# Patient Record
Sex: Female | Born: 1980 | Race: White | Hispanic: Yes | Marital: Married | Smoking: Never smoker
Health system: Southern US, Community
[De-identification: ages and names within clinical notes are randomized; demographics above are authoritative.]

## PROBLEM LIST (undated history)

## (undated) HISTORY — PX: BREAST SURGERY: SHX581

---

## 2000-10-28 ENCOUNTER — Other Ambulatory Visit: Admission: RE | Admit: 2000-10-28 | Discharge: 2000-10-28 | Payer: Self-pay | Admitting: *Deleted

## 2000-10-28 ENCOUNTER — Other Ambulatory Visit: Admission: RE | Admit: 2000-10-28 | Discharge: 2000-10-28 | Payer: Self-pay | Admitting: Urology

## 2001-03-14 ENCOUNTER — Inpatient Hospital Stay (HOSPITAL_COMMUNITY): Admission: AD | Admit: 2001-03-14 | Discharge: 2001-03-17 | Payer: Self-pay | Admitting: Obstetrics & Gynecology

## 2005-04-11 ENCOUNTER — Inpatient Hospital Stay (HOSPITAL_COMMUNITY): Admission: AD | Admit: 2005-04-11 | Discharge: 2005-04-12 | Payer: Self-pay | Admitting: *Deleted

## 2005-11-13 ENCOUNTER — Ambulatory Visit: Payer: Self-pay | Admitting: Gynecology

## 2005-11-13 ENCOUNTER — Inpatient Hospital Stay (HOSPITAL_COMMUNITY): Admission: AD | Admit: 2005-11-13 | Discharge: 2005-11-16 | Payer: Self-pay | Admitting: Gynecology

## 2005-11-19 ENCOUNTER — Ambulatory Visit: Payer: Self-pay | Admitting: Family Medicine

## 2007-04-24 ENCOUNTER — Emergency Department (HOSPITAL_COMMUNITY): Admission: EM | Admit: 2007-04-24 | Discharge: 2007-04-24 | Payer: Self-pay | Admitting: Family Medicine

## 2010-06-04 ENCOUNTER — Emergency Department (HOSPITAL_COMMUNITY)
Admission: EM | Admit: 2010-06-04 | Discharge: 2010-06-04 | Disposition: A | Discharge status: Home / Self Care | Payer: Self-pay | Attending: Emergency Medicine | Admitting: Emergency Medicine

## 2010-06-04 ENCOUNTER — Emergency Department (HOSPITAL_COMMUNITY): Payer: Self-pay

## 2010-06-04 DIAGNOSIS — R509 Fever, unspecified: Secondary | ICD-10-CM | POA: Insufficient documentation

## 2010-06-04 DIAGNOSIS — R0789 Other chest pain: Secondary | ICD-10-CM | POA: Insufficient documentation

## 2010-06-04 DIAGNOSIS — R0602 Shortness of breath: Secondary | ICD-10-CM | POA: Insufficient documentation

## 2010-06-04 DIAGNOSIS — M546 Pain in thoracic spine: Secondary | ICD-10-CM | POA: Insufficient documentation

## 2010-06-04 LAB — CBC
HCT: 38.8 % (ref 36.0–46.0)
MCHC: 33.5 g/dL (ref 30.0–36.0)
MCV: 84.5 fL (ref 78.0–100.0)
RBC: 4.59 MIL/uL (ref 3.87–5.11)
RDW: 13.2 % (ref 11.5–15.5)
WBC: 8.9 10*3/uL (ref 4.0–10.5)

## 2010-06-04 LAB — DIFFERENTIAL
Basophils Absolute: 0.1 10*3/uL (ref 0.0–0.1)
Lymphs Abs: 0.5 10*3/uL — ABNORMAL LOW (ref 0.7–4.0)
Monocytes Absolute: 1.3 10*3/uL — ABNORMAL HIGH (ref 0.1–1.0)
Neutro Abs: 6.4 10*3/uL (ref 1.7–7.7)

## 2010-06-04 LAB — URINALYSIS, ROUTINE W REFLEX MICROSCOPIC
Bilirubin Urine: NEGATIVE
Hgb urine dipstick: NEGATIVE
Protein, ur: NEGATIVE mg/dL
Specific Gravity, Urine: 1.005 (ref 1.005–1.030)

## 2010-06-04 LAB — BASIC METABOLIC PANEL
CO2: 22 mEq/L (ref 19–32)
Chloride: 108 mEq/L (ref 96–112)
Creatinine, Ser: 0.46 mg/dL (ref 0.4–1.2)
GFR calc non Af Amer: >60 mL/min (ref >60)
Glucose, Bld: 129 mg/dL — ABNORMAL HIGH (ref 70–99)

## 2010-06-04 LAB — PREGNANCY, URINE: Preg Test, Ur: NEGATIVE

## 2010-06-04 LAB — URINE MICROSCOPIC-ADD ON

## 2010-11-17 LAB — POCT URINALYSIS DIP (DEVICE)
Bilirubin Urine: NEGATIVE
Ketones, ur: NEGATIVE
Protein, ur: 30 — AB
Urobilinogen, UA: 1

## 2010-11-17 LAB — POCT PREGNANCY, URINE: Preg Test, Ur: NEGATIVE

## 2016-02-24 HISTORY — PX: BREAST CYST ASPIRATION: SHX578

## 2016-07-07 ENCOUNTER — Ambulatory Visit (HOSPITAL_COMMUNITY)
Admission: EM | Admit: 2016-07-07 | Discharge: 2016-07-07 | Disposition: A | Discharge status: Home / Self Care | Payer: Self-pay | Attending: Internal Medicine | Admitting: Internal Medicine

## 2016-07-07 ENCOUNTER — Encounter (HOSPITAL_COMMUNITY): Payer: Self-pay | Admitting: Emergency Medicine

## 2016-07-07 DIAGNOSIS — J029 Acute pharyngitis, unspecified: Secondary | ICD-10-CM

## 2016-07-07 DIAGNOSIS — R0982 Postnasal drip: Secondary | ICD-10-CM

## 2016-07-07 DIAGNOSIS — J301 Allergic rhinitis due to pollen: Secondary | ICD-10-CM

## 2016-07-07 NOTE — Discharge Instructions (Signed)
If more likely that your sore throat is caused by the drainage in the back of the throat. Since the Claritin and Joyce Copallegra is not working you may continue taking those and add Chlor-Trimeton 2 or 4 mg every 4 hours. This is a stronger antihistamine to decrease or drainage and it can cause drowsiness. Also drink more fluids, particularly water. Drink a glass of water before bedtime and upon getting up in the morning. This will take several days to resolve.

## 2016-07-07 NOTE — ED Provider Notes (Signed)
CSN: [id number]     Arrival date & time 07/07/16  1447 History   None    Chief Complaint  Patient presents with  . Sore Throat   (Consider location/radiation/quality/duration/timing/severity/associated sxs/prior Treatment) 36 year old spent female accompanied by significant other presents 1 day after she visited her PCP for laryngitis for PND and other allergy type symptoms. She was given the medicines that were listed in the nursing notes including Claritin, amoxicillin and a couple of other medications. She states that within one day she has not improved. She still feels as though something is in her throat.      History reviewed. No pertinent past medical history. History reviewed. No pertinent surgical history. History reviewed. No pertinent family history. Social History  Substance Use Topics  . Smoking status: Never Smoker  . Smokeless tobacco: Never Used  . Alcohol use No   OB History    No data available     Review of Systems  Constitutional: Negative.  Negative for activity change, chills and fever.  HENT: Positive for congestion, postnasal drip, rhinorrhea and sore throat.   Eyes: Negative.   Respiratory: Negative for cough, chest tightness and shortness of breath.   Cardiovascular: Negative for chest pain.  Gastrointestinal: Negative.   Genitourinary: Negative.   Skin: Negative.   All other systems reviewed and are negative.   Allergies  Patient has no known allergies.  Home Medications   Prior to Admission medications   Medication Sig Start Date End Date Taking? Authorizing Provider  amoxicillin (AMOXIL) 500 MG capsule Take 500 mg by mouth 3 (three) times daily.   Yes [provider]  benzonatate (TESSALON) 100 MG capsule Take by mouth 3 (three) times daily as needed for cough.   Yes [provider]  loratadine (CLARITIN) 10 MG tablet Take 10 mg by mouth daily.   Yes [provider]  naproxen (NAPROSYN) 500 MG tablet Take 500 mg  by mouth 2 (two) times daily with a meal.   Yes [provider]   Meds Ordered and Administered this Visit  Medications - No data to display  BP 130/78 (BP Location: Right Arm)   Pulse 67   Temp 98.3 F (36.8 C) (Oral)   Resp 18   SpO2 100%  No data found.   Physical Exam  Constitutional: She is oriented to person, place, and time. She appears well-developed and well-nourished. No distress.  HENT:  Mouth/Throat: No oropharyngeal exudate.  Oropharynx with moderate amount of clear PND and cobblestoning.  Neck: Neck supple.  Cardiovascular: Normal rate and regular rhythm.   Pulmonary/Chest: Effort normal and breath sounds normal.  Lymphadenopathy:    She has no cervical adenopathy.  Neurological: She is alert and oriented to person, place, and time.  Skin: Skin is warm and dry.  Nursing note and vitals reviewed.   Urgent Care Course     Procedures (including critical care time)  Labs Review Labs Reviewed - No data to display  Imaging Review No results found.   Visual Acuity Review  Right Eye Distance:   Left Eye Distance:   Bilateral Distance:    Right Eye Near:   Left Eye Near:    Bilateral Near:         MDM   1. Sore throat   2. PND (post-nasal drip)   3. Seasonal allergic rhinitis due to pollen    If more likely that your sore throat is caused by the drainage in the back of the throat. Since  the Claritin and Joyce Copa is not working you may continue taking those and add Chlor-Trimeton 2 or 4 mg every 4 hours. This is a stronger antihistamine to decrease or drainage and it can cause drowsiness. Also drink more fluids, particularly water. Drink a glass of water before bedtime and upon getting up in the morning. This will take several days to resolve.     Hayden Rasmussen, NP 07/07/16 1728

## 2016-07-07 NOTE — ED Triage Notes (Addendum)
[facility] a complaint of a sore throat and laryngitis x 1 week. The patient was evaluated yesterday and prescribed Naproxen, Tessalon, Claritin, and Amoxacillin and she stated that the medication has not worked.

## 2016-08-05 ENCOUNTER — Other Ambulatory Visit: Payer: Self-pay | Admitting: Emergency Medicine

## 2016-08-05 ENCOUNTER — Ambulatory Visit (INDEPENDENT_AMBULATORY_CARE_PROVIDER_SITE_OTHER): Payer: Self-pay | Admitting: Emergency Medicine

## 2016-08-05 ENCOUNTER — Encounter: Payer: Self-pay | Admitting: Emergency Medicine

## 2016-08-05 VITALS — BP 120/77 | HR 85 | Temp 98.7°F | Resp 16 | Ht 62.0 in | Wt 144.8 lb

## 2016-08-05 DIAGNOSIS — N6320 Unspecified lump in the left breast, unspecified quadrant: Secondary | ICD-10-CM

## 2016-08-05 DIAGNOSIS — N63 Unspecified lump in unspecified breast: Secondary | ICD-10-CM | POA: Insufficient documentation

## 2016-08-05 NOTE — Patient Instructions (Addendum)
  Ultrasound of left breast@ [phone] and they encourage you to Apply for Scholarship program so you don't have to come out of pocket for your mammogram.  Your appointment is Thursday 08/06/16 @ 1:00 @ [facility] [address], [address].   IF you received an x-ray today, you will receive an invoice from [facility]. Please contact [facility] at [phone] with questions or concerns regarding your invoice.   IF you received labwork today, you will receive an invoice from [facility]. Please contact [facility] at [phone] with questions or concerns regarding your invoice.   Our billing staff will not be able to assist you with questions regarding bills from these companies.  You will be contacted with the lab results as soon as they are available. The fastest way to get your results is to activate your My Chart account. Instructions are located on the last page of this paperwork. If you have not heard from us regarding the results in 2 weeks, please contact this office.

## 2016-08-05 NOTE — Progress Notes (Signed)
Joan Murphy 35 y.o.   Chief Complaint  Patient presents with  . Mass    painful lump on left breast, noticed 3 days ago     HISTORY OF PRESENT ILLNESS: This is a 36 y.o. female complaining of painful lump to left breast x 3 days.  HPI   Prior to Admission medications   Medication Sig Start Date End Date Taking? Authorizing Provider  amoxicillin (AMOXIL) 500 MG capsule Take 500 mg by mouth 3 (three) times daily.    [provider]  benzonatate (TESSALON) 100 MG capsule Take by mouth 3 (three) times daily as needed for cough.    [provider]  loratadine (CLARITIN) 10 MG tablet Take 10 mg by mouth daily.    [provider]  naproxen (NAPROSYN) 500 MG tablet Take 500 mg by mouth 2 (two) times daily with a meal.    [provider]    No Known Allergies  There are no active problems to display for this patient.   No past medical history on file.  No past surgical history on file.  Social History   Social History  . Marital status: Married    Spouse name: N/A  . Number of children: N/A  . Years of education: N/A   Occupational History  . Not on file.   Social History Main Topics  . Smoking status: Never Smoker  . Smokeless tobacco: Never Used  . Alcohol use No  . Drug use: No  . Sexual activity: Not on file   Other Topics Concern  . Not on file   Social History Narrative  . No narrative on file    No family history on file.   Review of Systems  Constitutional: Negative.  Negative for chills, fever and weight loss.  HENT: Negative.   Eyes: Negative.   Respiratory: Negative.  Negative for cough and shortness of breath.   Cardiovascular: Negative for chest pain and palpitations.  Gastrointestinal: Negative for abdominal pain, nausea and vomiting.  Musculoskeletal: Negative for myalgias and neck pain.  Skin: Negative for rash.  Neurological: Negative for dizziness and headaches.  Endo/Heme/Allergies: Negative.     All other systems reviewed and are negative.  Vitals:   08/05/16 0821  BP: 120/77  Pulse: 85  Resp: 16  Temp: 98.7 F (37.1 C)      Physical Exam  Constitutional: She is oriented to person, place, and time. She appears well-developed and well-nourished.  HENT:  Head: Normocephalic and atraumatic.  Eyes: Conjunctivae and EOM are normal. Pupils are equal, round, and reactive to light.  Neck: Normal range of motion. Neck supple. No JVD present. No thyromegaly present.  Cardiovascular: Normal rate, regular rhythm and normal heart sounds.   Pulmonary/Chest: Effort normal and breath sounds normal.    No nipple discharge or axillary adenopathy.  Abdominal: Soft.  Lymphadenopathy:    She has no cervical adenopathy.  Neurological: She is alert and oriented to person, place, and time.  Skin: Skin is warm and dry. Capillary refill takes less than 2 seconds.  Psychiatric: She has a normal mood and affect. Her behavior is normal.  Vitals reviewed.    ASSESSMENT & PLAN: Joan Murphy was seen today for mass.  Diagnoses and all orders for this visit:  Breast lump in female -     [place] Unlisted Procedure Breast; Future   Patient Instructions    Ultrasound of left breast@ [facility]-[phone] and they encourage you to Apply for Scholarship program so you  don't have to come out of pocket for your mammogram.  Your appointment is Thursday 08/06/16 @ 1:00 @ [facility] [address], [address].   IF you received an x-ray today, you will receive an invoice from [facility]. Please contact [facility] at [phone] with questions or concerns regarding your invoice.   IF you received labwork today, you will receive an invoice from [facility]. Please contact [facility] at [phone] with questions or concerns regarding your invoice.   Our billing staff will not be able to assist you with questions regarding bills from these companies.  You will be  contacted with the lab results as soon as they are available. The fastest way to get your results is to activate your My Chart account. Instructions are located on the last page of this paperwork. If you have not heard from us regarding the results in 2 weeks, please contact this office.          Edwina BarthMiguel Maricela Kawahara, MD [facility]

## 2016-08-06 ENCOUNTER — Other Ambulatory Visit: Payer: Self-pay | Admitting: Emergency Medicine

## 2016-08-06 ENCOUNTER — Telehealth: Payer: Self-pay | Admitting: Emergency Medicine

## 2016-08-06 ENCOUNTER — Ambulatory Visit
Admission: RE | Admit: 2016-08-06 | Discharge: 2016-08-06 | Disposition: A | Discharge status: Home / Self Care | Payer: No Typology Code available for payment source | Source: Ambulatory Visit | Attending: Emergency Medicine | Admitting: Emergency Medicine

## 2016-08-06 DIAGNOSIS — N6320 Unspecified lump in the left breast, unspecified quadrant: Secondary | ICD-10-CM

## 2016-08-06 NOTE — Addendum Note (Signed)
Addended by: Evie LacksSAGARDIA, Meleane Selinger J on: 08/06/2016 03:43 PM   Modules accepted: Orders

## 2016-08-06 NOTE — Telephone Encounter (Signed)
Spoke to patient about results and course of action. Scheduled for biopsy next Thursday.

## 2016-08-10 ENCOUNTER — Telehealth: Payer: Self-pay | Admitting: Emergency Medicine

## 2016-08-10 NOTE — Telephone Encounter (Signed)
Does she needs to be seen sooner than 7/3? Please advise

## 2016-08-10 NOTE — Telephone Encounter (Signed)
It can wait 

## 2016-08-10 NOTE — Telephone Encounter (Signed)
[facility] called and said they were having trouble contacting pt but also could not schedule pt until 08/25/16. They said they would check with their Doctors and see if anything else could be done, and I also told them I would try another office to see if we can get pt seen sooner.

## 2016-08-10 NOTE — Telephone Encounter (Signed)
Referrals sent for both oncology and general surgery on 6/15 to [facility] and [facility]. [facility] contacted me and said pt needs to follow up with general [facility] at [facility] and canceled the oncology referral.

## 2016-08-10 NOTE — Telephone Encounter (Signed)
Ok to wait

## 2016-08-11 NOTE — Telephone Encounter (Signed)
Pt is scheduled for 7/5 at 9am at [facility]. I spoke with pt to let her know this.

## 2016-08-13 ENCOUNTER — Ambulatory Visit (HOSPITAL_COMMUNITY)
Admission: RE | Admit: 2016-08-13 | Discharge: 2016-08-13 | Disposition: A | Discharge status: Home / Self Care | Payer: Self-pay | Source: Ambulatory Visit | Attending: Obstetrics and Gynecology | Admitting: Obstetrics and Gynecology

## 2016-08-13 ENCOUNTER — Ambulatory Visit
Admission: RE | Admit: 2016-08-13 | Discharge: 2016-08-13 | Disposition: A | Discharge status: Home / Self Care | Payer: No Typology Code available for payment source | Source: Ambulatory Visit | Attending: Emergency Medicine | Admitting: Emergency Medicine

## 2016-08-13 ENCOUNTER — Encounter (HOSPITAL_COMMUNITY): Payer: Self-pay

## 2016-08-13 VITALS — BP 114/70 | Ht <= 58 in | Wt 146.2 lb

## 2016-08-13 DIAGNOSIS — Z1239 Encounter for other screening for malignant neoplasm of breast: Secondary | ICD-10-CM

## 2016-08-13 DIAGNOSIS — N6321 Unspecified lump in the left breast, upper outer quadrant: Secondary | ICD-10-CM

## 2016-08-13 DIAGNOSIS — N6320 Unspecified lump in the left breast, unspecified quadrant: Secondary | ICD-10-CM

## 2016-08-13 NOTE — Telephone Encounter (Signed)
Melody with [facility] states that she does not think the pt needs to go to the breast surgeon.  If you have any questions you can reach her at [phone].

## 2016-08-13 NOTE — Patient Instructions (Signed)
Explained breast self awareness with Joan SersElizabeth M Murphy. Patient did not need a Pap smear today due to last Pap smear was in 2017 per patient. Let her know [facility] will cover Pap smears every 3 years unless has a history of abnormal Pap smears. [facility] per recommendation. Appointment scheduled for Thursday, August 13, 2016 at 0930.  Tennis [name] verbalized understanding.  Yunis Voorheis, Kathaleen Maserhristine Poll, RN 8:51 AM

## 2016-08-13 NOTE — Progress Notes (Signed)
Complaints of left breast lump x 1 week that was painful. Patient has been on antibiotic for 1 week and states the pain has resolved. Patient had a diagnostic mammogram and left breast ultrasound completed 08/06/2016 that recommended a left breast biopsy for follow up.  Pap Smear: Pap smear not completed today. [facility] and normal per patient. Per patient has a history of an abnormal Pap smear in 2007 that a repeat Pap smear was completed for follow up that was normal. No Pap smear results are in EPIC.  Physical exam: Breasts Breasts symmetrical. No skin abnormalities bilateral breasts. No nipple retraction bilateral breasts. No nipple discharge bilateral breasts. No lymphadenopathy. No lumps palpated right breast. Palpated a lump within the left breast at 2 o'clock 3 cm from the nipple. Complaints of tenderness when palpated lump within the left breast. [facility] for a left breast biopsy per recommendation. Appointment scheduled for Thursday, August 13, 2016 at 0930.        Pelvic/Bimanual No Pap smear completed today since last Pap smear was in 2017 per patient. Pap smear not indicated per BCCCP guidelines.   Smoking History: Patient has never smoked.  Patient Navigation: Patient education provided. Access to services provided for patient through [facility] program. Spanish interpreter provided.   Used Spanish interpreter [employer]anca Lindner from [employer].

## 2016-08-14 ENCOUNTER — Encounter (HOSPITAL_COMMUNITY): Payer: Self-pay | Admitting: *Deleted

## 2016-08-19 NOTE — Telephone Encounter (Signed)
Is this okay?

## 2016-08-19 NOTE — Telephone Encounter (Signed)
Yes, it's ok.

## 2017-07-28 ENCOUNTER — Ambulatory Visit: Payer: Self-pay | Admitting: Physician Assistant

## 2017-09-29 IMAGING — MG 2D DIGITAL DIAGNOSTIC BILATERAL MAMMOGRAM WITH CAD AND ADJUNCT T
8 of 15 series · 8 of 35 positions shown · non-contrast
Comparison: None available.

CLINICAL DATA: Painful palpable left breast mass.

EXAM:
2D DIGITAL DIAGNOSTIC BILATERAL MAMMOGRAM WITH CAD AND ADJUNCT TOMO
ULTRASOUND LEFT BREAST

[L MLO synth-2D]
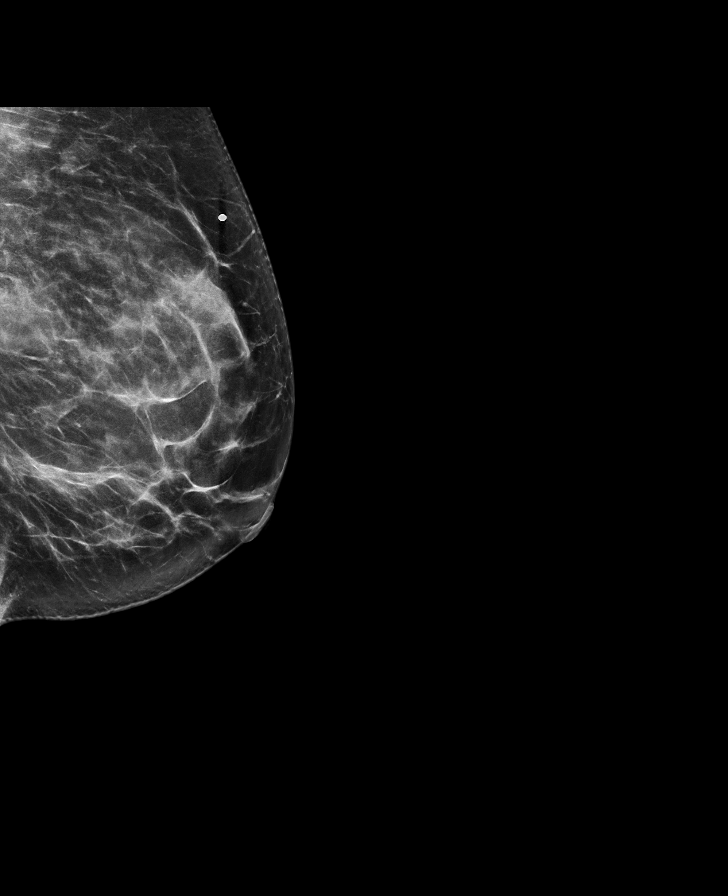

[L TAN]
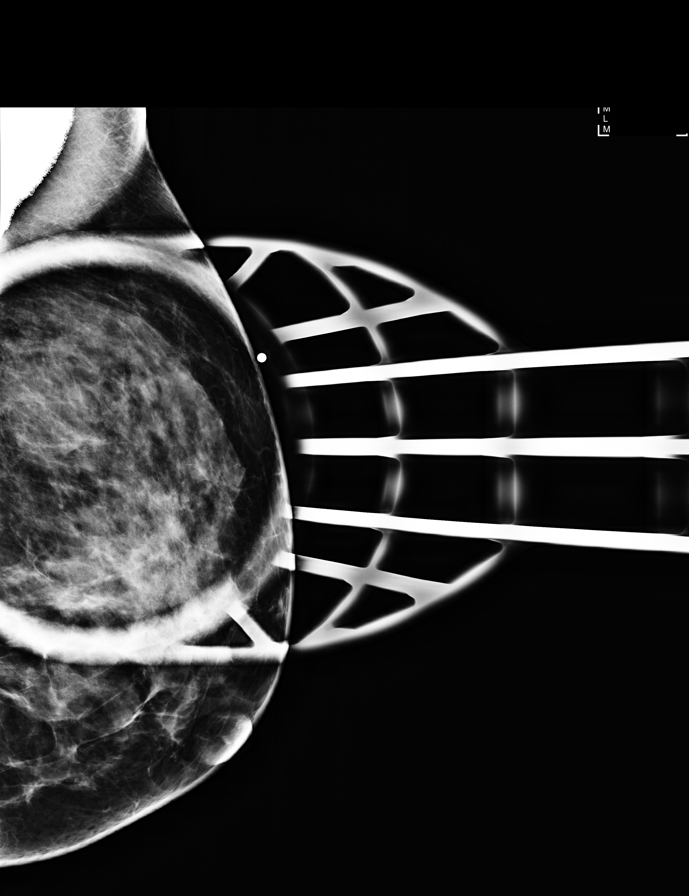

[R MLO synth-2D]
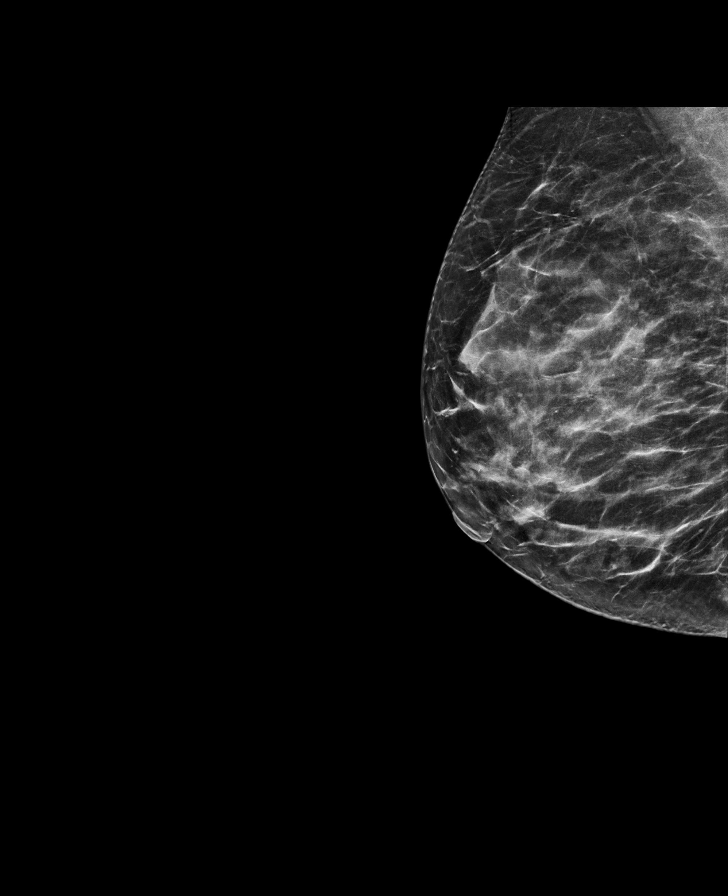

[L CC synth-2D]
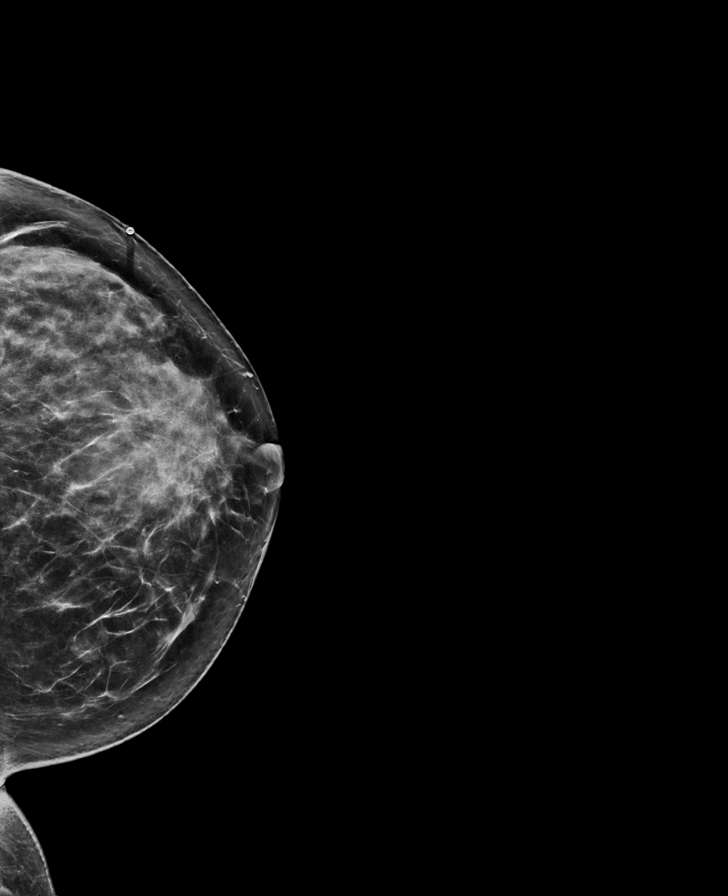

[R MLO]
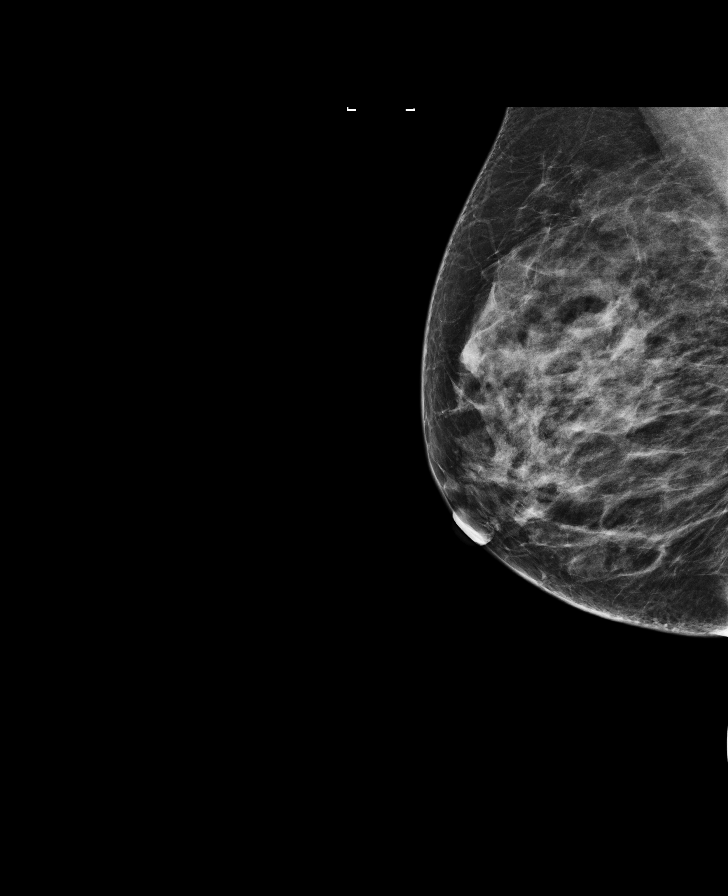

[L CC]
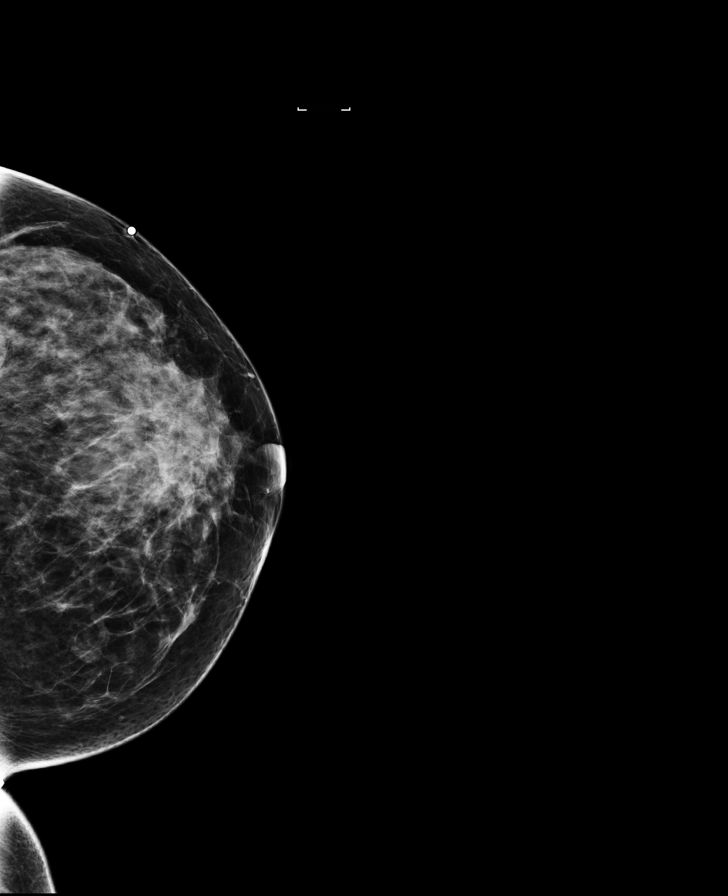

[L TAN synth-2D]
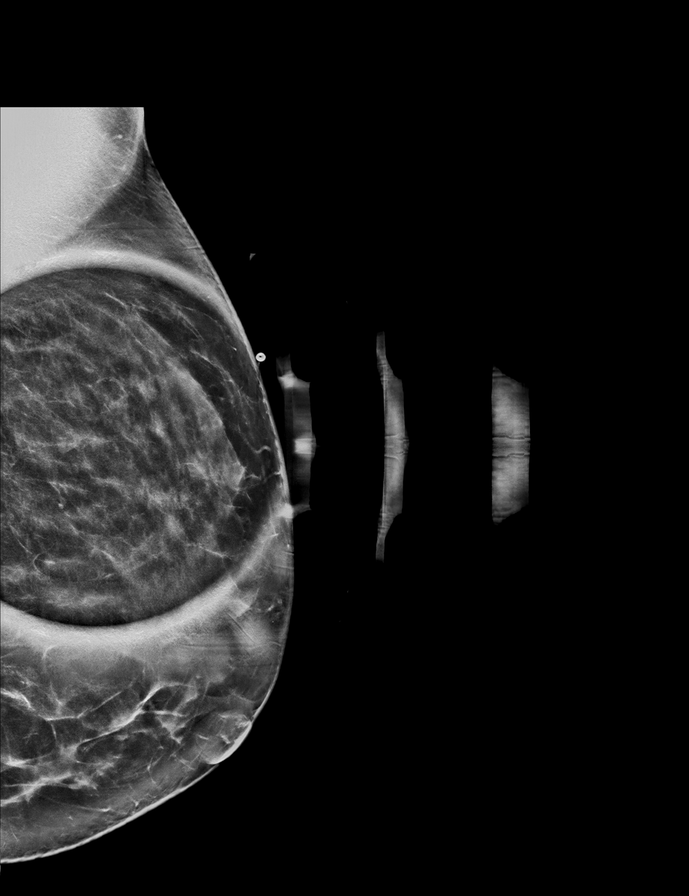

[R CC synth-2D]
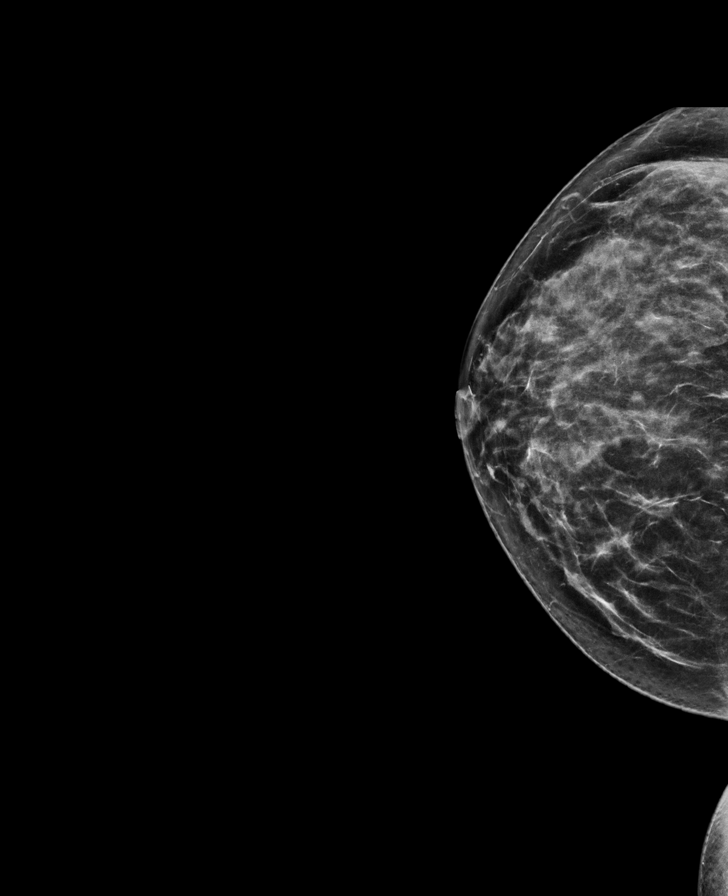

[8 of 35 positions shown; findings below may reference images not displayed]

ACR Breast Density Category c: The breast tissue is heterogeneously
dense, which may obscure small masses.
FINDINGS: Mammographically, there is an ill-defined mass in the outer central
left breast, posterior depth, better seen on the MLO view. No
suspicious masses are seen in the right breast.

Mammographic images were processed with CAD.

On physical exam, there is a firm palpable mass in the left 230
o'clock breast, middle depth.

Targeted ultrasound is performed, showing left breast 230 o'clock 6
cm from the nipple hypoechoic circumscribed mass which measures
x 1.3 x 2.5 cm. Posterior acoustic enhancement is noted. There is
surrounding breast edema. Ultrasound examination of the left axilla
demonstrates 3 mildly prominent lymph nodes with borderline cortical
thickening.
IMPRESSION: Left breast 230 o'clock palpable mass with surrounding breast edema.
This may represent breast abscess versus breast mass.

Three indeterminate left axillary lymph nodes.

RECOMMENDATION:
Cyst aspiration with evaluation of the aspirate by microbiology is
recommended. If aspiration is not successful, then core needle
biopsy of this mass should be considered at the time of the
aspiration. The patient was started on Keflex for presumed breast
abscess.

The management of the left axillary lymph nodes should be based on
pathology results. Lymph node sampling is recommended if pathology
results indicate presence of malignancy in the left breast.

I have discussed the findings and recommendations with the patient.
Results were also provided in writing at the conclusion of the
visit. If applicable, a reminder letter will be sent to the patient
regarding the next appointment.

BI-RADS CATEGORY  4: Suspicious.

## 2018-07-26 ENCOUNTER — Encounter: Payer: Self-pay | Admitting: Emergency Medicine

## 2018-07-26 ENCOUNTER — Ambulatory Visit: Payer: Self-pay | Admitting: Emergency Medicine

## 2018-07-26 ENCOUNTER — Other Ambulatory Visit: Payer: Self-pay

## 2018-07-26 VITALS — BP 96/60 | HR 72 | Temp 98.5°F | Resp 16 | Ht 59.0 in | Wt 150.6 lb

## 2018-07-26 DIAGNOSIS — N63 Unspecified lump in unspecified breast: Secondary | ICD-10-CM

## 2018-07-26 NOTE — Progress Notes (Signed)
Joan Murphy 38 y.o.   Chief Complaint  Patient presents with  . Breast Mass    left x 2 months    HISTORY OF PRESENT ILLNESS: This is a 38 y.o. female complaining of left breast lump for 2 months, smaller today but still hard, nontender, no nipple discharge.  No other significant symptoms.  HPI   Prior to Admission medications   Medication Sig Start Date End Date Taking? Authorizing Provider  loratadine (CLARITIN) 10 MG tablet Take 10 mg by mouth daily.   Yes [provider]  naproxen (NAPROSYN) 500 MG tablet Take 500 mg by mouth 2 (two) times daily with a meal.    [provider]    No Known Allergies  Patient Active Problem List   Diagnosis Date Noted  . Breast lump in female 08/05/2016    No past medical history on file.  No past surgical history on file.  Social History   Socioeconomic History  . Marital status: Married    Spouse name: Not on file  . Number of children: Not on file  . Years of education: Not on file  . Highest education level: Not on file  Occupational History  . Not on file  Social Needs  . Financial resource strain: Not on file  . Food insecurity:    Worry: Not on file    Inability: Not on file  . Transportation needs:    Medical: Not on file    Non-medical: Not on file  Tobacco Use  . Smoking status: Never Smoker  . Smokeless tobacco: Never Used  Substance and Sexual Activity  . Alcohol use: No  . Drug use: No  . Sexual activity: Yes    Birth control/protection: I.U.D., None  Lifestyle  . Physical activity:    Days per week: Not on file    Minutes per session: Not on file  . Stress: Not on file  Relationships  . Social connections:    Talks on phone: Not on file    Gets together: Not on file    Attends religious service: Not on file    Active member of club or organization: Not on file    Attends meetings of clubs or organizations: Not on file    Relationship status: Not on file  . Intimate partner  violence:    Fear of current or ex partner: Not on file    Emotionally abused: Not on file    Physically abused: Not on file    Forced sexual activity: Not on file  Other Topics Concern  . Not on file  Social History Narrative  . Not on file    Family History  Problem Relation Age of Onset  . Diabetes Mother   . Hypertension Mother      Review of Systems  Constitutional: Negative.  Negative for chills and fever.  Respiratory: Negative.  Negative for cough and shortness of breath.   Cardiovascular: Negative for chest pain and palpitations.  Gastrointestinal: Negative.  Negative for abdominal pain, nausea and vomiting.  Skin: Negative.  Negative for rash.  Neurological: Negative for dizziness and headaches.  All other systems reviewed and are negative.   Vitals:   07/26/18 0859  BP: 96/60  Pulse: 72  Resp: 16  Temp: 98.5 F (36.9 C)  SpO2: 98%    Physical Exam Vitals signs reviewed.  Constitutional:      Appearance: Normal appearance.  HENT:     Head: Normocephalic and atraumatic.  Cardiovascular:  Rate and Rhythm: Normal rate and regular rhythm.     Heart sounds: Normal heart sounds.  Chest:    Skin:    General: Skin is warm and dry.  Neurological:     General: No focal deficit present.     Mental Status: She is alert and oriented to person, place, and time.  Psychiatric:        Mood and Affect: Mood normal.      ASSESSMENT & PLAN: Joan Murphy was seen today for breast mass.  Diagnoses and all orders for this visit:  Breast lump -     US BREAST COMPLETE UNI LEFT INC AXILLA; Future    Patient Instructions       If you have lab work done today you will be contacted with your lab results within the next 2 weeks.  If you have not heard from us then please contact us. The fastest way to get your results is to register for My Chart. Quiste mamario Breast Cyst  Un quiste mamario es un saco lleno de lquido en la mama. Los quistes mamarios  generalmente no son cancerosos (benignos). Son General Electriccomunes en las mujeres, y la [place] de las veces se localizan en la porcin superior externa de la mama. Puede aparecer un quiste o ms. Se forman cuando se acumula lquido dentro de las [address]ulas mamarias. Hay varios tipos de quistes mamarios:  Macroquiste. Es un quiste de alrededor de 2pulgadas (5,1cm) de ancho (dimetro).  Microquiste. Es un quiste muy pequeo que no se puede palpar, Biomedical engineerpero que se ve con estudios de diagnstico por imgenes como una radiografa de la mama ([place]) o una ecografa.  Galactocele. Es un quiste que contiene [place]. Puede desarrollarse cuando se deja de amamantar bruscamente. Los quistes mamarios no aumentan el riesgo de Engineer, manufacturing systemssufrir cncer de mama. Generalmente desaparecen despus de la menopausia, excepto que tome hormonas artificiales (siga una terapia hormonal). Cules son las causas? Se desconoce la causa exacta de los ArvinMeritorquistes mamarios. Las causas posibles son las siguientes:  Obstruccin de los (conductos) de las glndulas [place]am[place], lo que produce una acumulacin de lquido. La obstruccin de un conducto puede ser consecuencia de lo siguiente: ? Cambios fibroqusticos en las mamas. Se trata de una afeccin comn y benigna que se manifiesta cuando las mujeres experimentan cambios hormonales durante el ciclo menstrual. Esta es una causa comn de quistes mamarios mltiples. ? Crecimiento excesivo del tejido Chesape[employer] Energymamario o de las glndulas [place]. ? Tejido cicatricial en la mama de una ciruga anterior.  Cambios en ciertas hormonas femeninas (estrgeno y progesterona). Qu incrementa el riesgo? Puede ser ms propensa a desarrollar quistes mamarios si an pasado por la menopausia. Cules son los signos o los sntomas? Los sntomas de un quiste mamario pueden ser los siguientes:  Sentir uno o ms bultos lisos, blandos, redondos, (como uvas) en la mama, que se mueven fcilmente. El bulto puede aumentar de tamao y ser  ms doloroso antes del perodo y puede achicarse cuando este finaliza.  Molestia o Journalist, newspaperdolor en el pecho. Cmo se diagnostica? El mdico podr palparlo durante un examen fsico. Se indicarn Thereasa Solouna mamografa y Neomia Dearuna ecografa para confirmar el diagnstico. Conley RollsLe extraern el lquido del quiste con Neomia Dearuna aguja (aspiracin con aguja fina) y lo analizarn para asegurarse de que el quiste no es canceroso. Cmo se trata? Es probable que no sea Quarry managernecesario realizar un tratamiento. El mdico controlar el quiste para observar si desaparece por s mismo. Si el quiste es molesto o Lesothoaumenta de [place], o si  no le gusta la apariencia de la mama debido al quiste, puede Network engineer. El tratamiento puede incluir lo siguiente:  Tratamiento hormonal.  Aspiracin con aguja fina para drenar el lquido del quiste. Hay una posibilidad de que el quiste vuelva a Research officer, trade union (recurrente) despus de la aspiracin.  Ciruga para extirpar el quiste. Siga estas indicaciones en su casa:  Consulte a su mdico regularmente. ? Hgase controles mdicos anualmente. ? Si tiene entre 20 y 40 aos, hgase un examen clnico de mamas cada 1 a 3 aos. Despus de los [address], hgase este examen todos los [place]. ? Hgase una mamografa con la frecuencia que se lo indiquen.  Hgase un autoexamen de [employer], o con la frecuencia que se lo indiquen. Si tiene [employer], o mamas "con bultos", puede ser ms difcil palpar nuevos bultos. Saber cmo se ven y se sienten las [employer], y anotar cualquier cambio en las mamas para poder informarle al mdico. Un autoexamen de mamas incluye lo siguiente: ? Comparar las mamas en el espejo. ? Buscar cambios visibles en la piel o los pezones. ? Palpar buscando bultos o cambios.  Tome los medicamentos de venta libre y los recetados solamente como se lo haya indicado el mdico.  Use un sostn de soporte, especialmente al hacer ejercicios.  Siga las indicaciones del  mdico respecto de las restricciones para las comidas y las bebidas. ? Evite la cafena. ? Reduzca la sal (sodio) en alimentos y bebidas, especialmente antes del perodo menstrual. Demasiado sodio puede causar acumulacin de lquidos (retencin), hinchazn de las mamas y [place].  Concurra a todas las visitas de control como se lo haya indicado el mdico. Esto es importante. Comunquese con un mdico si:  Siente, o cree que siente, un bulto en la mama.  Nota que ambas mamas son diferentes a lo habitual.  Las mamas an causan dolor despus que ha finalizado su perodo menstrual.  Descubre nuevos bultos o protuberancias.  Siente bultos en la axila. Solicite ayuda de inmediato si:  Siente un dolor intenso, sensibilidad, irritacin o calor en la mama.  Le sale lquido o sangre del pezn.  El bulto de la mama se endurece y le duele.  Nota hoyuelos o arrugas en la mama o el pezn. Esta informacin no tiene Theme park manager el consejo del mdico. Asegrese de hacerle al mdico cualquier pregunta que tenga. Document Released: 02/09/2005 Document Revised: 08/06/2016 Document Reviewed: 08/13/2015 Elsevier Interactive Patient Education  2019 [employer].   IF you received an x-ray today, you will receive an invoice from [facility]. Please contact [facility] at [phone] with questions or concerns regarding your invoice.   IF you received labwork today, you will receive an invoice from [place]. Please contact [facility] at [phone] with questions or concerns regarding your invoice.   Our billing staff will not be able to assist you with questions regarding bills from these companies.  You will be contacted with the lab results as soon as they are available. The fastest way to get your results is to activate your My Chart account. Instructions are located on the last page of this paperwork. If you have not heard from [place] regarding the results in 2 weeks, please  contact this office.         Edwina Barth, MD [facility]

## 2018-07-26 NOTE — Patient Instructions (Addendum)
If you have lab work done today you will be contacted with your lab results within the next 2 weeks.  If you have not heard from [place] then please contact us. The fastest way to get your results is to register for My Chart. Quiste mamario Breast Cyst  Un quiste mamario es un saco lleno de lquido en la mama. Los quistes mamarios generalmente no son cancerosos (benignos). Son General Electric, y la [place] de las veces se localizan en la porcin superior externa de la mama. Puede aparecer un quiste o ms. Se forman cuando se acumula lquido dentro de las [address]. Hay varios tipos de quistes mamarios:  Macroquiste. Es un quiste de alrededor de 2pulgadas (5,1cm) de ancho (dimetro).  Microquiste. Es un quiste muy pequeo que no se puede palpar, Biomedical engineer que se ve con estudios de diagnstico por imgenes como una radiografa de la mama ([place]) o una ecografa.  Galactocele. Es un quiste que contiene [place]. Puede desarrollarse cuando se deja de amamantar bruscamente. Los quistes mamarios no aumentan el riesgo de Engineer, manufacturing systems de mama. Generalmente desaparecen despus de la menopausia, excepto que tome hormonas artificiales (siga una terapia hormonal). Cules son las causas? Se desconoce la causa exacta de los [employer]. Las causas posibles son las siguientes:  Obstruccin de los (conductos) de las glndulas [place], lo que produce una acumulacin de lquido. La obstruccin de un conducto puede ser consecuencia de lo siguiente: ? Cambios fibroqusticos en las mamas. Se trata de una afeccin comn y benigna que se manifiesta cuando las mujeres experimentan cambios hormonales durante el ciclo menstrual. Esta es una causa comn de quistes mamarios mltiples. ? Crecimiento excesivo del tejido [employer] o de las glndulas [place]. ? Tejido cicatricial en la mama de una ciruga anterior.  Cambios en ciertas hormonas femeninas (estrgeno y progesterona). Qu incrementa el  riesgo? Puede ser ms propensa a desarrollar quistes mamarios si an pasado por la menopausia. Cules son los signos o los sntomas? Los sntomas de un quiste mamario pueden ser los siguientes:  Sentir uno o ms bultos lisos, blandos, redondos, (como uvas) en la mama, que se mueven fcilmente. El bulto puede aumentar de tamao y ser ms doloroso antes del perodo y puede achicarse cuando este finaliza.  Molestia o Journalist, newspaper. Cmo se diagnostica? El mdico podr palparlo durante un examen fsico. Se indicarn Thereasa Solo y Neomia Dear ecografa para confirmar el diagnstico. Conley Rolls extraern el lquido del quiste con Neomia Dear aguja (aspiracin con aguja fina) y lo analizarn para asegurarse de que el quiste no es canceroso. Cmo se trata? Es probable que no sea Quarry manager. El mdico controlar el quiste para observar si desaparece por s mismo. Si el quiste es molesto o [place] de Recruitment consultant, o si no le gusta la apariencia de la mama debido al quiste, puede Network engineer. El tratamiento puede incluir lo siguiente:  Tratamiento hormonal.  Aspiracin con aguja fina para drenar el lquido del quiste. Hay una posibilidad de que el quiste vuelva a Research officer, trade union (recurrente) despus de la aspiracin.  Ciruga para extirpar el quiste. Siga estas indicaciones en su casa:  Consulte a su mdico regularmente. ? Hgase controles mdicos anualmente. ? Si tiene entre 20 y 11 aos, hgase un examen clnico de mamas cada 1 a 3 aos. Despus de los [address] hgase este examen todos los [place]. ? Hgase una mamografa con la frecuencia que se lo indiquen.  Hgase un autoexamen de [employer]  meses, o con la frecuencia que se lo indiquen. Si tiene Continental Airlinesmuchos quistes mamarios, o mamas "con bultos", puede ser ms difcil palpar nuevos bultos. Saber cmo se ven y se sienten las Medco Health Solutionsmamas normalmente, y anotar cualquier cambio en las mamas para poder informarle al mdico. Un autoexamen de mamas incluye  lo siguiente: ? Comparar las mamas en el espejo. ? Buscar cambios visibles en la piel o los pezones. ? Palpar buscando bultos o cambios.  Tome los medicamentos de venta libre y los recetados solamente como se lo haya indicado el mdico.  Use un sostn de soporte, especialmente al hacer ejercicios.  Siga las indicaciones del mdico respecto de las restricciones para las comidas y las bebidas. ? Evite la cafena. ? Reduzca la sal (sodio) en alimentos y bebidas, especialmente antes del perodo menstrual. Demasiado sodio puede causar acumulacin de lquidos (retencin), hinchazn de las mamas y [place]est[place].  Concurra a todas las visitas de control como se lo haya indicado el mdico. Esto es importante. Comunquese con un mdico si:  Siente, o cree que siente, un bulto en la mama.  Nota que ambas mamas son diferentes a lo habitual.  Las mamas an causan dolor despus que ha finalizado su perodo menstrual.  Descubre nuevos bultos o protuberancias.  Siente bultos en la axila. Solicite ayuda de inmediato si:  Siente un dolor intenso, sensibilidad, irritacin o calor en la mama.  Le sale lquido o sangre del pezn.  El bulto de la mama se endurece y le duele.  Nota hoyuelos o arrugas en la mama o el pezn. Esta informacin no tiene Theme park managercomo fin reemplazar el consejo del mdico. Asegrese de hacerle al mdico cualquier pregunta que tenga. Document Released: 02/09/2005 Document Revised: 08/06/2016 Document Reviewed: 08/13/2015 Elsevier Interactive Patient Education  2019 [employer].   IF you received an x-ray today, you will receive an invoice [facility]. Please contact [facility] at [phone] with questions or concerns regarding your invoice.   IF you received labwork today, you will receive an [facility] from [facility]. Please contact [facility] at [phone] with questions or concerns regarding your invoice.   Our billing staff will not be able to assist you  with questions regarding bills from these companies.  You will be contacted with the lab results as soon as they are available. The fastest way to get your results is to activate your My Chart account. Instructions are located on the last page of this paperwork. If you have not heard from us regarding the results in 2 weeks, please contact this office.

## 2018-08-08 ENCOUNTER — Other Ambulatory Visit (HOSPITAL_COMMUNITY): Payer: Self-pay | Admitting: *Deleted

## 2018-08-08 DIAGNOSIS — N644 Mastodynia: Secondary | ICD-10-CM

## 2018-08-30 ENCOUNTER — Other Ambulatory Visit: Payer: Self-pay

## 2018-08-30 ENCOUNTER — Ambulatory Visit
Admission: RE | Admit: 2018-08-30 | Discharge: 2018-08-30 | Disposition: A | Discharge status: Home / Self Care | Payer: No Typology Code available for payment source | Source: Ambulatory Visit | Attending: Obstetrics and Gynecology | Admitting: Obstetrics and Gynecology

## 2018-08-30 ENCOUNTER — Other Ambulatory Visit (HOSPITAL_COMMUNITY): Payer: Self-pay | Admitting: *Deleted

## 2018-08-30 ENCOUNTER — Ambulatory Visit (HOSPITAL_COMMUNITY)
Admission: RE | Admit: 2018-08-30 | Discharge: 2018-08-30 | Disposition: A | Discharge status: Home / Self Care | Payer: Self-pay | Source: Ambulatory Visit | Attending: Obstetrics and Gynecology | Admitting: Obstetrics and Gynecology

## 2018-08-30 ENCOUNTER — Encounter (HOSPITAL_COMMUNITY): Payer: Self-pay

## 2018-08-30 DIAGNOSIS — N644 Mastodynia: Secondary | ICD-10-CM

## 2018-08-30 DIAGNOSIS — Z01419 Encounter for gynecological examination (general) (routine) without abnormal findings: Secondary | ICD-10-CM

## 2018-08-30 DIAGNOSIS — N6320 Unspecified lump in the left breast, unspecified quadrant: Secondary | ICD-10-CM

## 2018-08-30 NOTE — Progress Notes (Signed)
Complaints of left breast lump x 3 months.  Pap Smear: Pap smear completed today. Last Pap smear was in 2017 at the [facility] and normal per patient. Per patient has a history of an abnormal Pap smear in 2007 that a repeat Pap smear was completed for follow up that was normal. No Pap smear results are in EPIC.  Physical exam: Breasts Breasts symmetrical. No skin abnormalities bilateral breasts. No nipple retraction bilateral breasts. No nipple discharge bilateral breasts. No lymphadenopathy. No lumps palpated bilateral breasts. Unable to palpate a lump in patients area of concern. Patient stated the lump has decreased in size and that she has not been able to feel it lately. Complaints of left outer breast tenderness on exam. Referred patient to the [facility] for a diagnostic mammogram. Appointment scheduled for Tuesday, August 30, 2018 at 1450.        Pelvic/Bimanual   Ext Genitalia No lesions, no swelling and no discharge observed on external genitalia.         Vagina Vagina pink and normal texture. No lesions or discharge observed in vagina.          Cervix Cervix is present. Cervix pink and of normal texture. Cervix friable. IUD strings visualized. No discharge observed.     Uterus Uterus is present and palpable. Uterus in normal position and normal size.        Adnexae Bilateral ovaries present and palpable. No tenderness on palpation.         Rectovaginal No rectal exam completed today since patient had no rectal complaints. No skin abnormalities observed on exam.    Smoking History: Patient has never smoked.  Patient Navigation: Patient education provided. Access to services provided for patient through [facility] program. Spanish interpreter provided.   Breast and Cervical Cancer Risk Assessment: Patient has no family history of breast cancer, known genetic mutations, or radiation treatment to the chest before age 51. Patient has no history  of cervical dysplasia, immunocompromised, or DES exposure in-utero.  Risk Assessment    Risk Scores      08/30/2018   Last edited by: Armond Hang, LPN   5-year risk: 0.3 %   Lifetime risk: 6.4 %         Used Spanish interpreter Rudene Anda from [place].

## 2018-08-30 NOTE — Patient Instructions (Signed)
Explained breast self awareness with Doreene Adas. Let patient know [facility] will cover Pap smears and HPV typing every 5 years unless has a history of abnormal Pap smears. Referred patient to the [facility] for a diagnostic mammogram. Appointment scheduled for Tuesday, August 30, 2018 at 1450. Patient aware of appointment and will be there. Let patient know will follow up with her within the next couple weeks with results of Pap smear by letter or phone. [place] verbalized understanding.  [place], Arvil Chaco, RN 1:28 PM

## 2018-09-01 ENCOUNTER — Encounter (HOSPITAL_COMMUNITY): Payer: Self-pay | Admitting: *Deleted

## 2018-09-02 LAB — CYTOLOGY - PAP
Diagnosis: NEGATIVE
HPV: NOT DETECTED

## 2018-09-21 ENCOUNTER — Other Ambulatory Visit (HOSPITAL_COMMUNITY): Payer: Self-pay | Admitting: *Deleted

## 2018-09-21 ENCOUNTER — Telehealth (HOSPITAL_COMMUNITY): Payer: Self-pay | Admitting: *Deleted

## 2018-09-21 MED ORDER — METRONIDAZOLE 500 MG PO TABS: 500.0000 mg | ORAL_TABLET | Freq: Two times a day (BID) | ORAL | 0 refills | Status: DC

## 2018-09-21 NOTE — Telephone Encounter (Signed)
Telephoned patient at home and advised patient of negative pap smear results. HPV was negative. Pap smear did show yeast infection. Advised patient did call in [place]. Next pap smear due in five years. Used interpreter Rudene Anda.

## 2019-06-12 ENCOUNTER — Encounter: Payer: Self-pay | Admitting: Emergency Medicine

## 2019-06-12 ENCOUNTER — Other Ambulatory Visit: Payer: Self-pay

## 2019-06-12 ENCOUNTER — Telehealth (INDEPENDENT_AMBULATORY_CARE_PROVIDER_SITE_OTHER): Payer: No Typology Code available for payment source | Admitting: Emergency Medicine

## 2019-06-12 DIAGNOSIS — J029 Acute pharyngitis, unspecified: Secondary | ICD-10-CM

## 2019-06-12 NOTE — Progress Notes (Signed)
Telemedicine Encounter- SOAP NOTE Established Patient My chart video encounter attempted without success This telephone encounter was conducted with the patient's (or proxy's) verbal consent via audio telecommunications: yes/no: Yes Patient was instructed to have this encounter in a suitably private space; and to only have persons present to whom they give permission to participate. In addition, patient identity was confirmed by use of name plus two identifiers (DOB and address).  I discussed the limitations, risks, security and privacy concerns of performing an evaluation and management service by telephone and the availability of in person appointments. I also discussed with the patient that there may be a patient responsible charge related to this service. The patient expressed understanding and agreed to proceed.  I spent a total of TIME; 0 MIN TO 60 MIN: 15 minutes talking with the patient or their proxy.  No chief complaint on file.   Subjective   Joan Murphy is a 39 y.o. female established patient. Telephone visit today complaining of sore throat for several days.  Went to urgent care clinic last Saturday and started on amoxicillin.  Feeling better.  No additional complaints.  Tested negative for Covid.  HPI   There are no problems to display for this patient.   History reviewed. No pertinent past medical history.  Current Outpatient Medications  Medication Sig Dispense Refill  . loratadine (CLARITIN) 10 MG tablet Take 10 mg by mouth daily.    . metroNIDAZOLE (FLAGYL) 500 MG tablet Take 1 tablet (500 mg total) by mouth 2 (two) times daily. 14 tablet 0  . naproxen (NAPROSYN) 500 MG tablet Take 500 mg by mouth 2 (two) times daily with a meal.     No current facility-administered medications for this visit.    No Known Allergies  Social History   Socioeconomic History  . Marital status: Married    Spouse name: Not on file  . Number of children: 3  .  Years of education: Not on file  . Highest education level: 6th grade  Occupational History  . Not on file  Tobacco Use  . Smoking status: Never Smoker  . Smokeless tobacco: Never Used  Substance and Sexual Activity  . Alcohol use: No  . Drug use: No  . Sexual activity: Yes    Birth control/protection: I.U.D., None  Other Topics Concern  . Not on file  Social History Narrative  . Not on file   Social Determinants of Health   Financial Resource Strain:   . Difficulty of Paying Living Expenses:   Food Insecurity:   . Worried About Programme researcher, broadcasting/film/video in the Last Year:   . Barista in the Last Year:   Transportation Needs: No Transportation Needs  . Lack of Transportation (Medical): No  . Lack of Transportation (Non-Medical): No  Physical Activity:   . Days of Exercise per Week:   . Minutes of Exercise per Session:   Stress:   . Feeling of Stress :   Social Connections:   . Frequency of Communication with Friends and Family:   . Frequency of Social Gatherings with Friends and Family:   . Attends Religious Services:   . Active Member of Clubs or Organizations:   . Attends Banker Meetings:   Marland Kitchen Marital Status:   Intimate Partner Violence:   . Fear of Current or Ex-Partner:   . Emotionally Abused:   Marland Kitchen Physically Abused:   . Sexually Abused:     Review of Systems  Constitutional: Negative.  Negative for chills and fever.  HENT: Positive for sore throat. Negative for congestion and nosebleeds.   Eyes: Negative.   Respiratory: Negative.  Negative for cough.   Cardiovascular: Negative.  Negative for chest pain and palpitations.  Gastrointestinal: Negative.  Negative for abdominal pain, blood in stool, diarrhea, nausea and vomiting.  Genitourinary: Negative.  Negative for dysuria and hematuria.  Musculoskeletal: Negative.  Negative for back pain, myalgias and neck pain.  Skin: Negative.  Negative for rash.  Neurological: Negative.  Negative for  dizziness and headaches.  All other systems reviewed and are negative.   Objective  Alert and oriented x3 in no apparent respiratory distress. Vitals as reported by the patient: There were no vitals filed for this visit.  There are no diagnoses linked to this encounter. Diagnoses and all orders for this visit:  Sore throat  Acute pharyngitis, unspecified etiology  Continue and finish amoxicillin.  Office visit if no better in the next several days.   I discussed the assessment and treatment plan with the patient. The patient was provided an opportunity to ask questions and all were answered. The patient agreed with the plan and demonstrated an understanding of the instructions.   The patient was advised to call back or seek an in-person evaluation if the symptoms worsen or if the condition fails to improve as anticipated.  I provided 15 minutes of non-face-to-face time during this encounter.  Horald Pollen, MD  Primary Care at [facility]

## 2020-08-16 ENCOUNTER — Other Ambulatory Visit: Payer: Self-pay

## 2020-08-16 DIAGNOSIS — N6320 Unspecified lump in the left breast, unspecified quadrant: Secondary | ICD-10-CM

## 2020-09-03 ENCOUNTER — Encounter (INDEPENDENT_AMBULATORY_CARE_PROVIDER_SITE_OTHER): Payer: Self-pay

## 2020-09-03 ENCOUNTER — Ambulatory Visit
Admission: RE | Admit: 2020-09-03 | Discharge: 2020-09-03 | Disposition: A | Discharge status: Home / Self Care | Payer: No Typology Code available for payment source | Source: Ambulatory Visit | Attending: Obstetrics and Gynecology | Admitting: Obstetrics and Gynecology

## 2020-09-03 ENCOUNTER — Ambulatory Visit: Payer: Self-pay | Admitting: *Deleted

## 2020-09-03 ENCOUNTER — Other Ambulatory Visit: Payer: Self-pay

## 2020-09-03 VITALS — BP 114/82 | Wt 149.8 lb

## 2020-09-03 DIAGNOSIS — N6320 Unspecified lump in the left breast, unspecified quadrant: Secondary | ICD-10-CM

## 2020-09-03 DIAGNOSIS — N644 Mastodynia: Secondary | ICD-10-CM

## 2020-09-03 DIAGNOSIS — Z1239 Encounter for other screening for malignant neoplasm of breast: Secondary | ICD-10-CM

## 2020-09-03 NOTE — Patient Instructions (Signed)
Explained breast self awareness with Joan Murphy. Patient did not need a Pap smear today due to last Pap smear and HPV typing was 08/13/2016. Let her know BCCCP will cover Pap smears and HPV typing every 5 years unless has a history of abnormal Pap smears. Referred patient to the [facility] of [facility] for a diagnostic mammogram. Appointment scheduled Tuesday, September 03, 2020 at 1310. Patient aware of appointment and will be there. Joan Murphy verbalized understanding.  [name], [name], RN 11:01 AM

## 2020-09-03 NOTE — Progress Notes (Signed)
Ms. Joan Murphy is a 40 y.o. female who presents to [facility] clinic today with complaint of left breast lump x one month that is painful when touched. Patient rates the pain at a 3 out of 10.    Pap Smear: Pap smear not completed today. Last Pap smear was 08/13/2016 at [facility] clinic and was normal with negative HPV. Per patient has a history of an abnormal Pap smear in 2007 that a repeat Pap smear was completed for follow up that was normal. Per patient has had at least three normal Pap smears since abnormal. Last Pap smear result is available in Epic.   Physical exam: Breasts Breasts symmetrical. No skin abnormalities bilateral breasts. No nipple retraction bilateral breasts. No nipple discharge bilateral breasts. No lymphadenopathy. No lumps palpated right breast. Palpated a lump versus thickened tissue within the left breast at 1;30 o'clock 5 cm from the nipple. Complaints of left outer breast tenderness on exam.  MM DIAG BREAST TOMO BILATERAL  Result Date: 08/06/2016 CLINICAL DATA:  Painful palpable left breast mass. EXAM: 2D DIGITAL DIAGNOSTIC BILATERAL MAMMOGRAM WITH CAD AND ADJUNCT TOMO ULTRASOUND LEFT BREAST COMPARISON:  None available. ACR Breast Density Category c: The breast tissue is heterogeneously dense, which may obscure small masses. FINDINGS: Mammographically, there is an ill-defined mass in the outer central left breast, posterior depth, better seen on the MLO view. No suspicious masses are seen in the right breast. Mammographic images were processed with CAD. On physical exam, there is a firm palpable mass in the left 230 o'clock breast, middle depth. Targeted ultrasound is performed, showing left breast 230 o'clock 6 cm from the nipple hypoechoic circumscribed mass which measures 2.0 x 1.3 x 2.5 cm. Posterior acoustic enhancement is noted. There is surrounding breast edema. Ultrasound examination of the left axilla demonstrates 3 mildly prominent lymph nodes with borderline  cortical thickening. IMPRESSION: Left breast 230 o'clock palpable mass with surrounding breast edema. This may represent breast abscess versus breast mass. Three indeterminate left axillary lymph nodes. RECOMMENDATION: Cyst aspiration with evaluation of the aspirate by microbiology is recommended. If aspiration is not successful, then core needle biopsy of this mass should be considered at the time of the aspiration. The patient was started on Keflex for presumed breast abscess. The management of the left axillary lymph nodes should be based on pathology results. Lymph node sampling is recommended if pathology results indicate presence of malignancy in the left breast. I have discussed the findings and recommendations with the patient. Results were also provided in writing at the conclusion of the visit. If applicable, a reminder letter will be sent to the patient regarding the next appointment. BI-RADS CATEGORY  4: Suspicious. Electronically Signed   By: Ted Mcalpine M.D.   On: 08/06/2016 15:18   MS DIGITAL DIAG TOMO BILAT  Result Date: 08/30/2018 CLINICAL DATA:  40 year old female presenting for evaluation of a palpable area of "hardness" in the left breast for 3 months. EXAM: DIGITAL DIAGNOSTIC BILATERAL MAMMOGRAM WITH CAD AND TOMO ULTRASOUND LEFT BREAST COMPARISON:  Previous exam(s). ACR Breast Density Category c: The breast tissue is heterogeneously dense, which may obscure small masses. FINDINGS: A BB has been placed along the upper-outer aspect of the left breast indicating the palpable site of concern. There are no suspicious mammographic findings deep to the marker. No suspicious calcifications, masses or areas of distortion are seen in the bilateral breasts. Mammographic images were processed with CAD. Physical exam of the palpable site in the upper-outer left breast demonstrates  no discrete palpable masses. Ultrasound targeted to the left breast at the palpable site at 1:30, 5 cm from the  nipple, demonstrates normal dense fibroglandular tissue. No masses or suspicious areas of shadowing are identified. IMPRESSION: 1. There are no mammographic or targeted sonographic abnormalities at the palpable site of concern in the upper-outer left breast. 2.  No mammographic evidence of malignancy in the bilateral breasts. RECOMMENDATION: 1. Clinical follow-up recommended for the palpable area of concern in the upper-outer left breast. Any further workup should be based on clinical grounds. 2.  Screening mammogram in one year.(Code:[id number]) I have discussed the findings and recommendations with the patient. Results were also provided in writing at the conclusion of the visit. If applicable, a reminder letter will be sent to the patient regarding the next appointment. BI-RADS CATEGORY  1: Negative. Electronically Signed   By: Frederico Hamman M.D.   On: 08/30/2018 16:17    Pelvic/Bimanual Pap is not indicated today per BCCCP guidelines.   Smoking History: Patient has never smoked.   Patient Navigation: Patient education provided. Access to services provided for patient through [place] program. Spanish interpreter Natale Lay from [facility] provided.    Breast and Cervical Cancer Risk Assessment: Patient does not have family history of breast cancer, known genetic mutations, or radiation treatment to the chest before age 69. Patient does not have history of cervical dysplasia, immunocompromised, or DES exposure in-utero.  Risk Assessment     Risk Scores       09/03/2020 08/30/2018   Last edited by: Meryl Dare, CMA Stoney Bang H, LPN   5-year risk: 0.4 % 0.3 %   Lifetime risk: 6.3 % 6.4 %            A: BCCCP exam without pap smear Complaint of left breast lump and pain.  P: Referred patient to the [facility] of [facility] for a diagnostic mammogram. Appointment scheduled Tuesday, September 03, 2020 at 1310.  Priscille Heidelberg, RN 09/03/2020 11:01 AM

## 2020-12-10 ENCOUNTER — Ambulatory Visit: Payer: No Typology Code available for payment source | Admitting: Emergency Medicine

## 2020-12-11 ENCOUNTER — Ambulatory Visit (INDEPENDENT_AMBULATORY_CARE_PROVIDER_SITE_OTHER): Payer: Self-pay | Admitting: Family Medicine

## 2020-12-11 ENCOUNTER — Other Ambulatory Visit: Payer: Self-pay

## 2020-12-11 ENCOUNTER — Ambulatory Visit (INDEPENDENT_AMBULATORY_CARE_PROVIDER_SITE_OTHER): Payer: Self-pay | Admitting: Internal Medicine

## 2020-12-11 ENCOUNTER — Ambulatory Visit: Payer: Self-pay

## 2020-12-11 ENCOUNTER — Ambulatory Visit: Payer: No Typology Code available for payment source | Admitting: Emergency Medicine

## 2020-12-11 ENCOUNTER — Encounter: Payer: Self-pay | Admitting: Internal Medicine

## 2020-12-11 VITALS — BP 112/80 | HR 90 | Ht 59.0 in | Wt 154.0 lb

## 2020-12-11 VITALS — BP 112/80 | HR 90 | Temp 98.0°F | Ht 59.0 in | Wt 154.0 lb

## 2020-12-11 DIAGNOSIS — M65331 Trigger finger, right middle finger: Secondary | ICD-10-CM | POA: Insufficient documentation

## 2020-12-11 DIAGNOSIS — M79644 Pain in right finger(s): Secondary | ICD-10-CM

## 2020-12-11 DIAGNOSIS — M79672 Pain in left foot: Secondary | ICD-10-CM

## 2020-12-11 DIAGNOSIS — M722 Plantar fascial fibromatosis: Secondary | ICD-10-CM

## 2020-12-11 DIAGNOSIS — M67432 Ganglion, left wrist: Secondary | ICD-10-CM

## 2020-12-11 NOTE — Patient Instructions (Addendum)
Thank you for coming in today.   Please complete the exercises that the athletic trainer went over with you:  View at [email or web address]-exercise-[email or web address] using code: [id number]  You received an injection today. Seek immediate medical attention if the joint becomes red, extremely painful, or is oozing fluid.   Use the double band-aid splint  Ice massage  Heel gel cups  Night splints for your foot  Recheck back in 6 weeks

## 2020-12-11 NOTE — Progress Notes (Signed)
I, Philbert Riser, LAT, ATC acting as a scribe for Clementeen Graham, MD.  Subjective:    CC: L heel and R 3rd finger pain  HPI: Pt is a 40 y/o female c/o L heel and R 3rd finger pain.  L heel pain: Pt reports L heel pain ongoing for 5 months. Pt locates pain to the plantar aspect of her L feel. Pt c/o increased pain when she transitions to stand after sitting for awhile.   R 3rd finger pain: Pt reports R finger pain ongoing for over a year. Pt's locates pain and triggering to PIP joint of her R 3rd finger. Pt notes the finger will get stuck in flexion.   Pt also notes she had a ganglion cyst on her R wrist that was aspirated 3-4 years ago and the bump has returned over the last couple months. Pt c/o wrist "throbbing" esp when it's cold or w/ increased wrist motions.  Pertinent review of Systems: No fevers or chills  Relevant historical information: Seasonal allergy history.   Objective:    Vitals:   12/11/20 1432  BP: 112/80  Pulse: 90  SpO2: 98%   General: Well Developed, well nourished, and in no acute distress.   MSK: Right hand slight nodule dorsal wrist nontender.  Normal wrist motion. Volar hand normal-appearing Palmar third MCP mildly tender palpation with palpable nodule.  Triggering present with flexion of PIP joint.  Strength is intact.  Left foot normal. Tender palpation plantar calcaneus.  Normal foot and ankle motion.  Strength is intact. Pulses cap refill and sensation are intact distally.  Lab and Radiology Results  Procedure: Real-time Ultrasound Guided Injection of right third MCP A1 pulley tendon sheath (trigger finger injection) Device: Philips Affiniti 50G Images permanently stored and available for review in PACS Verbal informed consent obtained.  Discussed risks and benefits of procedure. Warned about infection bleeding damage to structures skin hypopigmentation and fat atrophy among others. Patient expresses understanding and agreement Time-out  conducted.   Noted no overlying erythema, induration, or other signs of local infection.   Skin prepped in a sterile fashion.   Local anesthesia: Topical Ethyl chloride.   With sterile technique and under real time ultrasound guidance: 40 mg of Kenalog and 1 mL of lidocaine injected into tendon sheath at A1 pulley. Fluid seen entering the tendon sheath.   Completed without difficulty   Pain immediately resolved suggesting accurate placement of the medication.   Advised to call if fevers/chills, erythema, induration, drainage, or persistent bleeding.   Images permanently stored and available for review in the ultrasound unit.  Impression: Technically successful ultrasound guided injection.        Impression and Recommendations:    Assessment and Plan: 40 y.o. female with  Left foot plantar fasciitis ongoing for 5 months.  Plan to treat with heel cushioning eccentric exercises ice massage and night splints.  Recheck in about 6 weeks.  Home exercise program reviewed by myself and ATC in clinic.  Right hand trigger finger.  Ongoing for over a year.  After discussion plan for trigger finger injection today and double Band-Aid splint.  Recheck in 6 weeks.  Right dorsal hand ganglion cyst.  Watchful waiting for now.  Lesser issue.Marland Kitchen  PDMP not reviewed this encounter. Orders Placed This Encounter  Procedures   [place] LIMITED JOINT SPACE STRUCTURES UP RIGHT(NO LINKED CHARGES)    Standing Status:   Future    Number of Occurrences:   1    Standing Expiration Date:  06/11/2021    Order Specific Question:   Reason for Exam (SYMPTOM  OR DIAGNOSIS REQUIRED)    Answer:   right finger pain    Order Specific Question:   Preferred imaging location?    Answer:   [facility]   No orders of the defined types were placed in this encounter.   Discussed warning signs or symptoms. Please see discharge instructions. Patient expresses understanding.   The above documentation has  been reviewed and is accurate and complete Clementeen Graham, M.D.  Visit conducted using a Spanish interpreter.

## 2020-12-11 NOTE — Patient Instructions (Addendum)
    You likely have plantar fasciitis and a trigger finger.     A referral was ordered for a sports medicine downstairs.

## 2020-12-11 NOTE — Progress Notes (Signed)
Subjective:    Patient ID: Joan Murphy, female    DOB: [birth year], 40 y.o.   MRN: [id number]  This visit occurred during the SARS-CoV-2 public health emergency.  Safety protocols were in place, including screening questions prior to the visit, additional usage of staff PPE, and extensive cleaning of exam room while observing appropriate contact time as indicated for disinfecting solutions.    HPI The patient is here for an acute visit.  She is here with her daughter who interprets for her.  Left heel pain-this started 5-6 months ago without injury.  She states pain is in 1 spot in the heel and feels like a throbbing type pain.  The pain does not radiate or move around.  The pain is worse when she first stands and starts walking.  She has not taken anything for the pain.  She denies any numbness or tingling.  She often wears sandals in her house.  She does not exercise on a regular basis.     Medications and allergies reviewed with patient and updated if appropriate.  Patient Active Problem List   Diagnosis Date Noted   Pain of left heel 12/11/2020   Trigger middle finger of right hand 12/11/2020   Plantar fasciitis, left 12/11/2020   Ganglion cyst of dorsum of left wrist 12/11/2020    Current Outpatient Medications on File Prior to Visit  Medication Sig Dispense Refill   loratadine (CLARITIN) 10 MG tablet Take 10 mg by mouth daily. (Patient not taking: No sig reported)     metroNIDAZOLE (FLAGYL) 500 MG tablet Take 1 tablet (500 mg total) by mouth 2 (two) times daily. (Patient not taking: No sig reported) 14 tablet 0   naproxen (NAPROSYN) 500 MG tablet Take 500 mg by mouth 2 (two) times daily with a meal. (Patient not taking: No sig reported)     No current facility-administered medications on file prior to visit.    History reviewed. No pertinent past medical history.  Past Surgical History:  Procedure Laterality Date   BREAST CYST ASPIRATION Left 2018    BREAST SURGERY     CESAREAN SECTION     3 previous    Social History   Socioeconomic History   Marital status: Married    Spouse name: Not on file   Number of children: 3   Years of education: Not on file   Highest education level: 6th grade  Occupational History   Not on file  Tobacco Use   Smoking status: Never   Smokeless tobacco: Never  Vaping Use   Vaping Use: Never used  Substance and Sexual Activity   Alcohol use: No   Drug use: No   Sexual activity: Yes    Birth control/protection: I.U.D., None  Other Topics Concern   Not on file  Social History Narrative   Not on file   Social Determinants of Health   Financial Resource Strain: Not on file  Food Insecurity: No Food Insecurity   Worried About Running Out of Food in the Last Year: Never true   Ran Out of Food in the Last Year: Never true  Transportation Needs: No Transportation Needs   Lack of Transportation (Medical): No   Lack of Transportation (Non-Medical): No  Physical Activity: Not on file  Stress: Not on file  Social Connections: Not on file    Family History  Problem Relation Age of Onset   Diabetes Mother    Hypertension Mother     Review of  Systems     Objective:   Vitals:   12/11/20 1325  BP: 112/80  Pulse: 90  Temp: 98 F (36.7 C)  SpO2: 98%   BP Readings from Last 3 Encounters:  12/11/20 112/80  12/11/20 112/80  09/03/20 114/82   Wt Readings from Last 3 Encounters:  12/11/20 154 lb (69.9 kg)  12/11/20 154 lb (69.9 kg)  09/03/20 149 lb 12.8 oz (67.9 kg)   Body mass index is 31.1 kg/m.   Physical Exam Constitutional:      General: She is not in acute distress.    Appearance: Normal appearance. She is not ill-appearing.  HENT:     Head: Normocephalic and atraumatic.  Musculoskeletal:        General: Tenderness (Tenderness with palpation at insertion site of plantar fascia-no pain elsewhere throughout the foot or ankle) present. No swelling or deformity.     Right  lower leg: No edema.     Left lower leg: No edema.  Skin:    General: Skin is warm and dry.     Findings: No bruising, erythema or lesion.  Neurological:     Mental Status: She is alert.     Sensory: No sensory deficit.     Motor: No weakness.           Assessment & Plan:    Left heel pain Acute Likely Planter fasciitis Stressed not going barefoot and to ideally wear sneakers all the time Advised stretching the foot several times throughout the day Can roll the foot on tennis ball or ice water bottle Discussed that we can do an anti-inflammatory on a regular basis to see if that helps-she deferred a prescription and will take ibuprofen.  Advised to take this with food Will refer to sports medicine for further evaluation and treatment  Right middle trigger finger Acute Discussed causes Will refer to sports medicine for further treatment

## 2021-01-21 NOTE — Progress Notes (Deleted)
   I, Christoper Fabian, LAT, ATC, am serving as scribe for Dr. Clementeen Graham.  Aldonia Keeven Cathren Harsh is a 40 y.o. female who presents to [facility] today for f/u of L heel pain/plantar fasciitis and R 3rd finger pain and R wrist pain.  She was last seen by Dr. Denyse Amass on 12/11/20 and had a R 3rd finger trigger finger injection.  She was also Alfredson's exercises for her L heel and advised to purchase a night splint for her foot/heel.  Today, pt reports    Pertinent review of systems: ***  Relevant historical information: ***   Exam:  There were no vitals taken for this visit. General: Well Developed, well nourished, and in no acute distress.   MSK: ***    Lab and Radiology Results No results found for this or any previous visit (from the past 72 hour(s)). No results found.     Assessment and Plan: 39 y.o. female with ***   PDMP not reviewed this encounter. No orders of the defined types were placed in this encounter.  No orders of the defined types were placed in this encounter.    Discussed warning signs or symptoms. Please see discharge instructions. Patient expresses understanding.   ***

## 2021-01-22 ENCOUNTER — Ambulatory Visit: Payer: No Typology Code available for payment source | Admitting: Family Medicine

## 2021-03-06 ENCOUNTER — Encounter: Payer: Self-pay | Admitting: Family Medicine

## 2021-03-06 ENCOUNTER — Other Ambulatory Visit: Payer: Self-pay

## 2021-03-06 ENCOUNTER — Ambulatory Visit (INDEPENDENT_AMBULATORY_CARE_PROVIDER_SITE_OTHER): Payer: Self-pay | Admitting: Family Medicine

## 2021-03-06 ENCOUNTER — Ambulatory Visit (INDEPENDENT_AMBULATORY_CARE_PROVIDER_SITE_OTHER): Payer: Self-pay

## 2021-03-06 ENCOUNTER — Ambulatory Visit: Payer: Self-pay

## 2021-03-06 VITALS — BP 120/84 | HR 76 | Ht 59.0 in | Wt 158.4 lb

## 2021-03-06 DIAGNOSIS — M79672 Pain in left foot: Secondary | ICD-10-CM

## 2021-03-06 DIAGNOSIS — M722 Plantar fascial fibromatosis: Secondary | ICD-10-CM

## 2021-03-06 DIAGNOSIS — M67472 Ganglion, left ankle and foot: Secondary | ICD-10-CM

## 2021-03-06 NOTE — Progress Notes (Signed)
There is a heel spur present on the bottom of the heel which is typical for plantar fasciitis.  The area where you hurt in the forefoot is normal-appearing to radiology

## 2021-03-06 NOTE — Patient Instructions (Addendum)
Good to see you today.  Con't your home exercises, doing 30-45 slow heel lowers every day, taking between 5-7 sec to lower your heels down to the start position.  Please get an Xray today before you leave.  Use a CAM walker boot as needed.  You can purchase this at [facility] on [address] if needed.  Address: [address] [place], [place] [address]  Follow-up: one month

## 2021-03-06 NOTE — Progress Notes (Signed)
I, Wendy Poet, LAT, ATC, am serving as scribe for Dr. Lynne Leader.  Joan Murphy is a 41 y.o. female who presents to [place] at [facility] today for f/u of L foot pain.  She was last seen on 12/11/20 for L heel pain/plantar fasciitis and was shown Alfredson's exercises and advised to use heel gel cups and a night splint.  Today, pt reports con't L foot pain w/ newer bumps/lumps on her L medial heel and L dorsal foot w/ the newest issue being the bump on her dorsal foot x 3 weeks.  She reports pain w/ weight-bearing and at rest.  She notes throbbing pain at rest.  She has been doing her HEP and using the heel cushion.  She has been using IcyHot but is not taking any medication.    Pertinent review of systems: No fevers or chills  Relevant historical information: Planter fasciitis left foot.   Exam:  BP 120/84 (BP Location: Left Arm, Patient Position: Sitting, Cuff Size: Normal)    Pulse 76    Ht 4\' 11"  (1.499 m)    Wt 158 lb 6.4 oz (71.8 kg)    LMP 02/20/2021    SpO2 97%    BMI 31.99 kg/m  General: Well Developed, well nourished, and in no acute distress.   MSK: Left foot small dorsal bump or nodule midfoot.  Otherwise normal-appearing Normal foot and ankle motion. Tender palpation plantar calcaneus and along lateral dorsal forefoot.  Not particularly tender at dorsal nodule midfoot.     Lab and Radiology Results  Diagnostic Limited MSK Ultrasound of: Left foot Dorsal nodule midfoot is a difficult structure consistent in appearance with ganglion cyst measuring 2 mm x 7 mm. Bony and tendinous structures along the dorsal forefoot and midfoot normal-appearing Impression: Ganglion cyst dorsal midfoot left foot.   X-ray images left foot obtained today personally and independently interpreted Plantar calcaneal spur.  No evidence of stress fractures along the metatarsals.  No visible acute fractures.  No significant DJD visible. Await formal radiology  review   Assessment and Plan: 41 y.o. female with left foot pain.  Patient has 2 different issues of foot pain.  She has chronic left plantar calcaneus pain thought to be due to Planter fasciitis.  I think she has been limping from this loading the lateral forefoot which is produced the lateral forefoot pain.  She also has a ganglion cyst on the dorsal foot which is not particularly painful.  This I think is somewhat incidental and a bit of a nonfactor.  I think if we treat the Planter fasciitis more effectively both of her pain issues will resolve.  After questioning her she really has not been doing home exercises much at all.  Plan to reemphasize home exercises for Planter fasciitis and recheck in a month.  We discussed that if not better in 1 month or at least improving in a month would consider plantar fascial injection.  I offered her an injection today and she declined.  Additionally a cam walker boot could be helpful specially she is having a fair amount of pain.  She could get one of those at [facility] supply.  We discussed this as well.  Is for the ganglion cyst certainly could inject that as well although it so small I do not think is causing much of a problem.   PDMP not reviewed this encounter. Orders Placed This Encounter  Procedures   [place] LIMITED JOINT SPACE STRUCTURES LOW  LEFT(NO LINKED CHARGES)    Order Specific Question:   Reason for Exam (SYMPTOM  OR DIAGNOSIS REQUIRED)    Answer:   L foot pain    Order Specific Question:   Preferred imaging location?    Answer:   [place] [facility]   DG Foot Complete Left    Standing Status:   Future    Number of Occurrences:   1    Standing Expiration Date:   04/06/2021    Order Specific Question:   Reason for Exam (SYMPTOM  OR DIAGNOSIS REQUIRED)    Answer:   L foot pain    Order Specific Question:   Is patient pregnant?    Answer:   No    Order Specific Question:   Preferred imaging location?    Answer:   [facility]   No orders of the defined types were placed in this encounter.    Discussed warning signs or symptoms. Please see discharge instructions. Patient expresses understanding.   The above documentation has been reviewed and is accurate and complete Lynne Leader, M.D.

## 2021-04-04 NOTE — Progress Notes (Unsigned)
° °  I, Christoper Fabian, LAT, ATC, am serving as scribe for Dr. Clementeen Graham.  Joan Murphy is a 41 y.o. female who presents to [facility] at [facility] today for f/u of L foot pain due to plantar fasciitis and newer-onset dorsal-lateral foot pain.  She was last seen by Dr. Denyse Amass on 03/06/21 and was advised to con't her Alfredson's exercises and to purchase a CAM walker boot if needed.  Today, pt reports   Diagnostic testing: L foot XR- 03/06/21  Pertinent review of systems: ***  Relevant historical information: ***   Exam:  There were no vitals taken for this visit. General: Well Developed, well nourished, and in no acute distress.   MSK: ***    Lab and Radiology Results No results found for this or any previous visit (from the past 72 hour(s)). No results found.     Assessment and Plan: 41 y.o. female with ***   PDMP not reviewed this encounter. No orders of the defined types were placed in this encounter.  No orders of the defined types were placed in this encounter.    Discussed warning signs or symptoms. Please see discharge instructions. Patient expresses understanding.   ***

## 2021-04-07 ENCOUNTER — Ambulatory Visit: Payer: No Typology Code available for payment source | Admitting: Family Medicine

## 2021-11-17 ENCOUNTER — Encounter (HOSPITAL_BASED_OUTPATIENT_CLINIC_OR_DEPARTMENT_OTHER): Payer: Self-pay | Admitting: Emergency Medicine

## 2021-11-17 ENCOUNTER — Emergency Department (HOSPITAL_BASED_OUTPATIENT_CLINIC_OR_DEPARTMENT_OTHER)
Admission: EM | Admit: 2021-11-17 | Discharge: 2021-11-18 | Disposition: A | Discharge status: Home / Self Care | Payer: Self-pay | Attending: Emergency Medicine | Admitting: Emergency Medicine

## 2021-11-17 ENCOUNTER — Other Ambulatory Visit: Payer: Self-pay

## 2021-11-17 DIAGNOSIS — R1011 Right upper quadrant pain: Secondary | ICD-10-CM | POA: Insufficient documentation

## 2021-11-17 DIAGNOSIS — R1013 Epigastric pain: Secondary | ICD-10-CM | POA: Insufficient documentation

## 2021-11-17 LAB — COMPREHENSIVE METABOLIC PANEL
ALT: 16 U/L (ref 0–44)
AST: 17 U/L (ref 15–41)
Albumin: 3.6 g/dL (ref 3.5–5.0)
Alkaline Phosphatase: 58 U/L (ref 38–126)
Anion gap: 8 (ref 5–15)
BUN: 14 mg/dL (ref 6–20)
CO2: 22 mmol/L (ref 22–32)
Calcium: 8.4 mg/dL — ABNORMAL LOW (ref 8.9–10.3)
Chloride: 106 mmol/L (ref 98–111)
Creatinine, Ser: 0.52 mg/dL (ref 0.44–1.00)
GFR, Estimated: >60 mL/min (ref >60)
Glucose, Bld: 99 mg/dL (ref 70–99)
Potassium: 3.7 mmol/L (ref 3.5–5.1)
Sodium: 136 mmol/L (ref 135–145)
Total Bilirubin: 0.3 mg/dL (ref 0.3–1.2)
Total Protein: 7 g/dL (ref 6.5–8.1)

## 2021-11-17 LAB — URINALYSIS, ROUTINE W REFLEX MICROSCOPIC
Bilirubin Urine: NEGATIVE
Glucose, UA: NEGATIVE mg/dL
Ketones, ur: NEGATIVE mg/dL
Leukocytes,Ua: NEGATIVE
Nitrite: NEGATIVE
Protein, ur: NEGATIVE mg/dL
Specific Gravity, Urine: 1.02 (ref 1.005–1.030)
pH: 7 (ref 5.0–8.0)

## 2021-11-17 LAB — URINALYSIS, MICROSCOPIC (REFLEX): WBC, UA: NONE SEEN WBC/hpf (ref 0–5)

## 2021-11-17 LAB — CBC
HCT: 39.7 % (ref 36.0–46.0)
Hemoglobin: 13.2 g/dL (ref 12.0–15.0)
MCH: 28 pg (ref 26.0–34.0)
MCHC: 33.2 g/dL (ref 30.0–36.0)
MCV: 84.1 fL (ref 80.0–100.0)
Platelets: 349 10*3/uL (ref 150–400)
RBC: 4.72 MIL/uL (ref 3.87–5.11)
RDW: 12.6 % (ref 11.5–15.5)
WBC: 10.5 10*3/uL (ref 4.0–10.5)
nRBC: 0 % (ref 0.0–0.2)

## 2021-11-17 LAB — PREGNANCY, URINE: Preg Test, Ur: NEGATIVE

## 2021-11-17 LAB — LIPASE, BLOOD: Lipase: 30 U/L (ref 11–51)

## 2021-11-17 NOTE — ED Triage Notes (Signed)
Patient arrived via POV c/o abdominal pain across RUQ/LUQ x 1 week. Patient states 10/10 pain. Patient denies N/V. Patient is AO x 4, VS WDL, normal gait.

## 2021-11-18 ENCOUNTER — Emergency Department (HOSPITAL_BASED_OUTPATIENT_CLINIC_OR_DEPARTMENT_OTHER): Payer: Self-pay

## 2021-11-18 MED ORDER — SODIUM CHLORIDE 0.9 % IV BOLUS
1000.0000 mL | Freq: Once | INTRAVENOUS | Status: AC
  Administered 2021-11-18: 1000 mL via INTRAVENOUS

## 2021-11-18 MED ORDER — ALUM & MAG HYDROXIDE-SIMETH 200-200-20 MG/5ML PO SUSP
15.0000 mL | Freq: Once | ORAL | Status: AC
  Administered 2021-11-18: 15 mL via ORAL
  Filled 2021-11-18: qty 30

## 2021-11-18 MED ORDER — SUCRALFATE 1 G PO TABS: 1.0000 g | ORAL_TABLET | Freq: Three times a day (TID) | ORAL | 0 refills | Status: AC

## 2021-11-18 MED ORDER — FAMOTIDINE IN NACL 20-0.9 MG/50ML-% IV SOLN
20.0000 mg | Freq: Once | INTRAVENOUS | Status: AC
  Administered 2021-11-18: 20 mg via INTRAVENOUS
  Filled 2021-11-18: qty 50

## 2021-11-18 MED ORDER — IOHEXOL 300 MG/ML  SOLN
100.0000 mL | Freq: Once | INTRAMUSCULAR | Status: AC | PRN
  Administered 2021-11-18: 100 mL via INTRAVENOUS

## 2021-11-18 MED ORDER — PANTOPRAZOLE SODIUM 20 MG PO TBEC: 20.0000 mg | DELAYED_RELEASE_TABLET | Freq: Every day | ORAL | 0 refills | Status: AC

## 2021-11-18 NOTE — ED Provider Notes (Addendum)
[name] Provider Note   CSN: [id number] Arrival date & time: 11/17/21  2111     History  Chief Complaint  Patient presents with   Abdominal Pain    Joan Murphy is a 41 y.o. female.  Patient as above with significant medical history as below, including C-section x3 who presents to the ED with complaint of abdominal pain.  Symptoms ongoing approximately 2 weeks, gradually worsening.  Described as burning, aching, sharp at times, cramping sensation, RUQ, epigastric primarily.  No nausea or vomiting.  No BRBPR or melena.  No diarrhea.  No change in urination.  She thinks it may worsen with p.o. intake but she is unsure.  Frequently eats very spicy foods, no daily NSAIDS or etoh/tobacco use. Reports similar symptoms in the past but did resolve spontaneously after few days w/o intervention. Denies history of GI evaluation in the past.  No medications prior to arrival for symptom control.  Prior abdominal surgery includes C-section x3.  Denies fevers, chills, rashes, recent travel or sick contacts, no suspicious p.o. intake. No cp or dib     History reviewed. No pertinent past medical history.  Past Surgical History:  Procedure Laterality Date   BREAST CYST ASPIRATION Left 2018   BREAST SURGERY     CESAREAN SECTION     3 previous     The history is provided by the patient. The history is limited by a language barrier. No language interpreter was used (She prefers to have son translate for her, offered translator).  Abdominal Pain Associated symptoms: no chest pain, no cough, no dysuria, no fever, no nausea and no shortness of breath        Home Medications Prior to Admission medications   Medication Sig Start Date End Date Taking? Authorizing Provider  pantoprazole (PROTONIX) 20 MG tablet Take 1 tablet (20 mg total) by mouth daily for 14 days. 11/18/21 12/02/21 Yes Wynona Dove A, DO  sucralfate (CARAFATE) 1 g tablet Take 1 tablet  (1 g total) by mouth with breakfast, with lunch, and with evening meal for 7 days. 11/18/21 11/25/21 Yes Jeanell Sparrow, DO      Allergies    Patient has no known allergies.    Review of Systems   Review of Systems  Constitutional:  Negative for activity change and fever.  HENT:  Negative for facial swelling and trouble swallowing.   Eyes:  Negative for discharge and redness.  Respiratory:  Negative for cough and shortness of breath.   Cardiovascular:  Negative for chest pain and palpitations.  Gastrointestinal:  Positive for abdominal pain. Negative for nausea.  Genitourinary:  Negative for dysuria and flank pain.  Musculoskeletal:  Negative for back pain and gait problem.  Skin:  Negative for pallor and rash.  Neurological:  Negative for syncope and headaches.    Physical Exam Updated Vital Signs BP 133/82   Pulse 78   Temp 98.2 F (36.8 C) (Oral)   Resp 17   Ht 5' (1.524 m)   Wt 65.8 kg   LMP  (LMP Unknown)   SpO2 99%   BMI 28.32 kg/m  Physical Exam Vitals and nursing note reviewed.  Constitutional:      General: She is not in acute distress.    Appearance: Normal appearance. She is well-developed. She is not ill-appearing or diaphoretic.  HENT:     Head: Normocephalic and atraumatic.     Right Ear: External ear normal.     Left Ear:  External ear normal.     Nose: Nose normal.     Mouth/Throat:     Mouth: Mucous membranes are moist.  Eyes:     General: No scleral icterus.       Right eye: No discharge.        Left eye: No discharge.  Cardiovascular:     Rate and Rhythm: Normal rate and regular rhythm.     Pulses: Normal pulses.     Heart sounds: Normal heart sounds.  Pulmonary:     Effort: Pulmonary effort is normal. No respiratory distress.     Breath sounds: Normal breath sounds.  Abdominal:     General: Abdomen is flat.     Palpations: Abdomen is soft.     Tenderness: There is abdominal tenderness in the right upper quadrant and epigastric area. There is  no guarding or rebound. Negative signs include Murphy's sign.    Musculoskeletal:        General: Normal range of motion.     Cervical back: Normal range of motion.     Right lower leg: No edema.     Left lower leg: No edema.  Skin:    General: Skin is warm and dry.     Capillary Refill: Capillary refill takes less than 2 seconds.  Neurological:     Mental Status: She is alert.  Psychiatric:        Mood and Affect: Mood normal.        Behavior: Behavior normal.     ED Results / Procedures / Treatments   Labs (all labs ordered are listed, but only abnormal results are displayed) Labs Reviewed  COMPREHENSIVE METABOLIC PANEL - Abnormal; Notable for the following components:      Result Value   Calcium 8.4 (*)    All other components within normal limits  URINALYSIS, ROUTINE W REFLEX MICROSCOPIC - Abnormal; Notable for the following components:   Hgb urine dipstick TRACE (*)    All other components within normal limits  URINALYSIS, MICROSCOPIC (REFLEX) - Abnormal; Notable for the following components:   Bacteria, UA RARE (*)    All other components within normal limits  LIPASE, BLOOD  CBC  PREGNANCY, URINE    EKG EKG Interpretation  Date/Time:  Monday November 17 2021 21:37:34 EDT Ventricular Rate:  79 PR Interval:  156 QRS Duration: 86 QT Interval:  342 QTC Calculation: 392 R Axis:   101 Text Interpretation: Normal sinus rhythm Rightward axis Borderline ECG No previous ECGs available No old tracing to compare no stemi Confirmed by Wynona Dove ([id number]) on 11/18/2021 2:08:16 AM  Radiology CT ABDOMEN PELVIS W CONTRAST  Result Date: 11/18/2021 CLINICAL DATA:  Abdominal pain, acute, nonlocalized epigastric/periumb pain/ruq EXAM: CT ABDOMEN AND PELVIS WITH CONTRAST TECHNIQUE: Multidetector CT imaging of the abdomen and pelvis was performed using the standard protocol following bolus administration of intravenous contrast. RADIATION DOSE REDUCTION: This exam was performed  according to the departmental dose-optimization program which includes automated exposure control, adjustment of the mA and/or kV according to patient size and/or use of iterative reconstruction technique. CONTRAST:  166m OMNIPAQUE IOHEXOL 300 MG/ML  SOLN COMPARISON:  None Available. FINDINGS: Lower chest: No acute abnormality Hepatobiliary: No focal hepatic abnormality. Gallbladder unremarkable. Pancreas: No focal abnormality or ductal dilatation. Spleen: No focal abnormality.  Normal size. Adrenals/Urinary Tract: 3 mm nonobstructing stone in the lower pole of the left kidney. No ureteral stones or hydronephrosis. No renal or adrenal mass. Urinary bladder unremarkable. Stomach/Bowel: Normal appendix. Stomach,  large and small bowel grossly unremarkable. Vascular/Lymphatic: No evidence of aneurysm or adenopathy. Reproductive: Uterus and adnexa unremarkable. No mass. IUD in place within the uterus. Other: No free fluid or free air. Musculoskeletal: No acute bony abnormality. IMPRESSION: No acute findings in the abdomen or pelvis. Left lower pole nephrolithiasis.  No hydronephrosis. Electronically Signed   By: Rolm Baptise M.D.   On: 11/18/2021 00:53    Procedures Procedures    Medications Ordered in ED Medications  famotidine (PEPCID) IVPB 20 mg premix (0 mg Intravenous Stopped 11/18/21 0153)  alum & mag hydroxide-simeth (MAALOX/MYLANTA) 200-200-20 MG/5ML suspension 15 mL (15 mLs Oral Given 11/18/21 0026)  sodium chloride 0.9 % bolus 1,000 mL (1,000 mLs Intravenous New Bag/Given 11/18/21 0032)  iohexol (OMNIPAQUE) 300 MG/ML solution 100 mL (100 mLs Intravenous Contrast Given 11/18/21 0044)    ED Course/ Medical Decision Making/ A&P                           Medical Decision Making Amount and/or Complexity of Data Reviewed Labs: ordered. Radiology: ordered.  Risk OTC drugs. Prescription drug management.   This patient presents to the ED with chief complaint(s) of abdominal pain with pertinent  past medical history of above which further complicates the presenting complaint. The complaint involves an extensive differential diagnosis and also carries with it a high risk of complications and morbidity.    Differential diagnosis includes but is not exclusive to acute cholecystitis, intrathoracic causes for epigastric abdominal pain, gastritis, duodenitis, pancreatitis, small bowel or large bowel obstruction, abdominal aortic aneurysm, hernia, gastritis, etc.  . Serious etiologies were considered.   The initial plan is to screening labs, imaging, GI cocktail, IV fluids   Additional history obtained: Additional history obtained from family Records reviewed Primary Care Documents prior labs and imaging  Independent labs interpretation:  The following labs were independently interpreted:  CBC was stable,  CMP was also stable, no elevation to LFTs or bilirubin alk phos Patient with normal limits. UA with trace hemoglobin, rare bacteria, no WBCs   Independent visualization of imaging: - I independently visualized the following imaging with scope of interpretation limited to determining acute life threatening conditions related to emergency care: CTAP, which revealed no acute process  Cardiac monitoring was reviewed and interpreted by myself which shows NSR  Treatment and Reassessment: Gi cocktail Ivf >> symptoms have greatly improved  Consultation: - Consulted or discussed management/test interpretation w/ external professional: na  Consideration for admission or further workup: Admission was considered   Well-appearing 41 year old female with history as above to the ED for right upper quadrant, epigastric discomfort.  Burning, sharp, stabbing.  Physical exam with epigastric TTP, nonperitoneal abdomen.  Reviewed and are stable.  Imaging reviewed and stable.  Symptoms have greatly improved following GI cocktail.  No melena.  No evidence of ongoing GI bleed.  She does frequently  eat very spicy foods as a mainstay of her diet.  No frequent NSAID use, no tobacco use.  Recommend bland diet, will give PPI for 2 weeks, advise she follow-up with GI in 2 weeks.  Strict return precautions were discussed  The patient improved significantly and was discharged in stable condition. Detailed discussions were had with the patient regarding current findings, and need for close f/u with PCP or on call doctor. The patient has been instructed to return immediately if the symptoms worsen in any way for re-evaluation. Patient verbalized understanding and is in agreement with current care  plan. All questions answered prior to discharge.    Social Determinants of health: Social History   Tobacco Use   Smoking status: Never   Smokeless tobacco: Never  Vaping Use   Vaping Use: Never used  Substance Use Topics   Alcohol use: No   Drug use: No            Final Clinical Impression(s) / ED Diagnoses Final diagnoses:  Epigastric pain    Rx / DC Orders ED Discharge Orders          Ordered    sucralfate (CARAFATE) 1 g tablet  3 times daily with meals        11/18/21 0208    pantoprazole (PROTONIX) 20 MG tablet  Daily        11/18/21 0208              Jeanell Sparrow, DO 11/18/21 0214    Jeanell Sparrow, DO 11/18/21 0214

## 2021-11-18 NOTE — Discharge Instructions (Addendum)
Fue un placer atenderle hoy en el departamento de emergencias.  Regrese al departamento de emergencias si cualquier sntoma que empeore o sea preocupante.  Evite el tabaco, el alcohol y la cafena durante las prximas 2 semanas. Le recomendamos que siga una dieta blanda, es decir, sin alimentos picantes ni fritos ni procesados. Coma cosas como pltanos, arroz, pur de [place] y [place]. Frutas y vegetales.   It was a pleasure caring for you today in the emergency department.  Please return to the emergency department for any worsening or worrisome symptoms.  Please avoid tobacco, alcohol, caffeine next 2 weeks. Recommend you follow bland diet- that means no spicy foods, no fried/processed foods. Eat things like bananas, rice, applesauce, toast. Fruits and vegetables.
